# Patient Record
Sex: Female | Born: 1970 | Race: Black or African American | Hispanic: No | Marital: Married | Smoking: Never smoker
Health system: Southern US, Community
[De-identification: ages and names within clinical notes are randomized; demographics above are authoritative.]

## PROBLEM LIST (undated history)

## (undated) DIAGNOSIS — E7800 Pure hypercholesterolemia, unspecified: Secondary | ICD-10-CM

## (undated) DIAGNOSIS — R569 Unspecified convulsions: Secondary | ICD-10-CM

## (undated) DIAGNOSIS — R5383 Other fatigue: Secondary | ICD-10-CM

## (undated) DIAGNOSIS — K219 Gastro-esophageal reflux disease without esophagitis: Secondary | ICD-10-CM

## (undated) DIAGNOSIS — N946 Dysmenorrhea, unspecified: Secondary | ICD-10-CM

## (undated) DIAGNOSIS — N61 Mastitis without abscess: Secondary | ICD-10-CM

## (undated) DIAGNOSIS — D241 Benign neoplasm of right breast: Secondary | ICD-10-CM

## (undated) DIAGNOSIS — D509 Iron deficiency anemia, unspecified: Secondary | ICD-10-CM

## (undated) DIAGNOSIS — R55 Syncope and collapse: Secondary | ICD-10-CM

## (undated) DIAGNOSIS — I6389 Other cerebral infarction: Secondary | ICD-10-CM

## (undated) DIAGNOSIS — D219 Benign neoplasm of connective and other soft tissue, unspecified: Secondary | ICD-10-CM

## (undated) DIAGNOSIS — N6009 Solitary cyst of unspecified breast: Secondary | ICD-10-CM

## (undated) DIAGNOSIS — I10 Essential (primary) hypertension: Secondary | ICD-10-CM

## (undated) DIAGNOSIS — IMO0002 Reserved for concepts with insufficient information to code with codable children: Secondary | ICD-10-CM

## (undated) DIAGNOSIS — R32 Unspecified urinary incontinence: Secondary | ICD-10-CM

## (undated) DIAGNOSIS — R5381 Other malaise: Secondary | ICD-10-CM

## (undated) HISTORY — DX: Dysmenorrhea, unspecified: N94.6

## (undated) HISTORY — DX: Reserved for concepts with insufficient information to code with codable children: IMO0002

## (undated) HISTORY — DX: Other malaise: R53.81

## (undated) HISTORY — DX: Iron deficiency anemia, unspecified: D50.9

## (undated) HISTORY — DX: Solitary cyst of unspecified breast: N60.09

## (undated) HISTORY — DX: Unspecified urinary incontinence: R32

## (undated) HISTORY — DX: Benign neoplasm of connective and other soft tissue, unspecified: D21.9

## (undated) HISTORY — DX: Unspecified convulsions: R56.9

## (undated) HISTORY — DX: Other fatigue: R53.83

## (undated) HISTORY — DX: Essential (primary) hypertension: I10

## (undated) HISTORY — DX: Pure hypercholesterolemia, unspecified: E78.00

## (undated) HISTORY — DX: Benign neoplasm of right breast: D24.1

## (undated) HISTORY — DX: Gastro-esophageal reflux disease without esophagitis: K21.9

## (undated) HISTORY — DX: Syncope and collapse: R55

## (undated) HISTORY — DX: Other cerebral infarction: I63.89

## (undated) HISTORY — DX: Mastitis without abscess: N61.0

---

## 1994-04-24 HISTORY — PX: TUBAL LIGATION: SHX77

## 1997-10-02 ENCOUNTER — Other Ambulatory Visit: Admission: RE | Admit: 1997-10-02 | Discharge: 1997-10-02 | Payer: Self-pay | Admitting: Obstetrics and Gynecology

## 1998-05-11 ENCOUNTER — Encounter: Payer: Self-pay | Admitting: Emergency Medicine

## 1998-05-11 ENCOUNTER — Emergency Department (HOSPITAL_COMMUNITY): Admission: EM | Admit: 1998-05-11 | Discharge: 1998-05-11 | Payer: Self-pay | Admitting: Emergency Medicine

## 1999-03-09 ENCOUNTER — Encounter: Payer: Self-pay | Admitting: Emergency Medicine

## 1999-03-09 ENCOUNTER — Inpatient Hospital Stay (HOSPITAL_COMMUNITY): Admission: EM | Admit: 1999-03-09 | Discharge: 1999-03-13 | Payer: Self-pay | Admitting: Emergency Medicine

## 1999-10-18 ENCOUNTER — Encounter: Payer: Self-pay | Admitting: Family Medicine

## 1999-10-18 ENCOUNTER — Encounter: Admission: RE | Admit: 1999-10-18 | Discharge: 1999-10-18 | Payer: Self-pay | Admitting: Family Medicine

## 1999-11-14 ENCOUNTER — Ambulatory Visit (HOSPITAL_BASED_OUTPATIENT_CLINIC_OR_DEPARTMENT_OTHER): Admission: RE | Admit: 1999-11-14 | Discharge: 1999-11-14 | Payer: Self-pay | Admitting: General Surgery

## 1999-11-14 ENCOUNTER — Encounter (INDEPENDENT_AMBULATORY_CARE_PROVIDER_SITE_OTHER): Payer: Self-pay | Admitting: *Deleted

## 2000-06-24 ENCOUNTER — Emergency Department (HOSPITAL_COMMUNITY): Admission: EM | Admit: 2000-06-24 | Discharge: 2000-06-24 | Payer: Self-pay | Admitting: Emergency Medicine

## 2003-03-25 ENCOUNTER — Encounter: Admission: RE | Admit: 2003-03-25 | Discharge: 2003-03-25 | Payer: Self-pay | Admitting: Family Medicine

## 2004-02-07 ENCOUNTER — Observation Stay (HOSPITAL_COMMUNITY): Admission: EM | Admit: 2004-02-07 | Discharge: 2004-02-08 | Payer: Self-pay

## 2004-02-07 ENCOUNTER — Encounter (INDEPENDENT_AMBULATORY_CARE_PROVIDER_SITE_OTHER): Payer: Self-pay | Admitting: Specialist

## 2004-04-24 HISTORY — PX: APPENDECTOMY: SHX54

## 2004-10-05 ENCOUNTER — Emergency Department (HOSPITAL_COMMUNITY): Admission: EM | Admit: 2004-10-05 | Discharge: 2004-10-05 | Payer: Self-pay | Admitting: Family Medicine

## 2004-11-03 ENCOUNTER — Encounter: Admission: RE | Admit: 2004-11-03 | Discharge: 2004-11-03 | Payer: Self-pay | Admitting: Internal Medicine

## 2004-11-03 ENCOUNTER — Encounter (INDEPENDENT_AMBULATORY_CARE_PROVIDER_SITE_OTHER): Payer: Self-pay | Admitting: *Deleted

## 2005-05-09 ENCOUNTER — Other Ambulatory Visit: Admission: RE | Admit: 2005-05-09 | Discharge: 2005-05-09 | Payer: Self-pay | Admitting: Obstetrics & Gynecology

## 2006-05-29 ENCOUNTER — Emergency Department (HOSPITAL_COMMUNITY): Admission: EM | Admit: 2006-05-29 | Discharge: 2006-05-30 | Payer: Self-pay | Admitting: Family Medicine

## 2006-05-31 ENCOUNTER — Ambulatory Visit: Payer: Self-pay | Admitting: Cardiovascular Disease

## 2006-05-31 LAB — CONVERTED CEMR LAB
Free T4: 0.8 ng/dL (ref 0.6–1.6)
TSH: 0.56 microintl units/mL (ref 0.35–5.50)

## 2006-06-07 ENCOUNTER — Ambulatory Visit: Payer: Self-pay

## 2006-06-07 ENCOUNTER — Encounter: Payer: Self-pay | Admitting: Cardiology

## 2006-06-07 ENCOUNTER — Ambulatory Visit: Payer: Self-pay | Admitting: Cardiovascular Disease

## 2007-04-25 LAB — CONVERTED CEMR LAB

## 2008-05-25 ENCOUNTER — Encounter: Admission: RE | Admit: 2008-05-25 | Discharge: 2008-05-25 | Payer: Self-pay | Admitting: Family Medicine

## 2008-08-27 ENCOUNTER — Emergency Department (HOSPITAL_COMMUNITY): Admission: EM | Admit: 2008-08-27 | Discharge: 2008-08-27 | Payer: Self-pay | Admitting: Emergency Medicine

## 2008-12-01 ENCOUNTER — Encounter: Admission: RE | Admit: 2008-12-01 | Discharge: 2008-12-01 | Payer: Self-pay | Admitting: Family Medicine

## 2009-04-24 HISTORY — PX: BREAST SURGERY: SHX581

## 2009-06-28 ENCOUNTER — Encounter: Admission: RE | Admit: 2009-06-28 | Discharge: 2009-06-28 | Payer: Self-pay | Admitting: Family Medicine

## 2009-09-08 ENCOUNTER — Ambulatory Visit: Payer: Self-pay | Admitting: Internal Medicine

## 2009-09-08 DIAGNOSIS — I10 Essential (primary) hypertension: Secondary | ICD-10-CM

## 2009-09-08 DIAGNOSIS — R5381 Other malaise: Secondary | ICD-10-CM

## 2009-09-08 DIAGNOSIS — R569 Unspecified convulsions: Secondary | ICD-10-CM | POA: Insufficient documentation

## 2009-09-08 DIAGNOSIS — D509 Iron deficiency anemia, unspecified: Secondary | ICD-10-CM | POA: Insufficient documentation

## 2009-09-08 DIAGNOSIS — R5383 Other fatigue: Secondary | ICD-10-CM

## 2009-09-08 HISTORY — DX: Unspecified convulsions: R56.9

## 2009-09-08 HISTORY — DX: Essential (primary) hypertension: I10

## 2009-09-08 HISTORY — DX: Iron deficiency anemia, unspecified: D50.9

## 2009-09-08 HISTORY — DX: Other malaise: R53.81

## 2009-09-08 LAB — CONVERTED CEMR LAB
ALT: 13 units/L (ref 0–35)
AST: 29 units/L (ref 0–37)
Albumin: 4.3 g/dL (ref 3.5–5.2)
Alkaline Phosphatase: 29 units/L — ABNORMAL LOW (ref 39–117)
BUN: 10 mg/dL (ref 6–23)
Basophils Absolute: 0 10*3/uL (ref 0.0–0.1)
Basophils Relative: 0.5 % (ref 0.0–3.0)
Bilirubin, Direct: 0 mg/dL (ref 0.0–0.3)
CO2: 30 meq/L (ref 19–32)
Calcium: 9.3 mg/dL (ref 8.4–10.5)
Chloride: 104 meq/L (ref 96–112)
Creatinine, Ser: 0.6 mg/dL (ref 0.4–1.2)
Eosinophils Absolute: 0 10*3/uL (ref 0.0–0.7)
Eosinophils Relative: 0.4 % (ref 0.0–5.0)
Ferritin: 3.1 ng/mL — ABNORMAL LOW (ref 10.0–291.0)
Folate: 12.5 ng/mL
GFR calc non Af Amer: 136.22 mL/min (ref >60)
Glucose, Bld: 86 mg/dL (ref 70–99)
HCT: 29.6 % — ABNORMAL LOW (ref 36.0–46.0)
Hemoglobin: 9.3 g/dL — ABNORMAL LOW (ref 12.0–15.0)
Lymphocytes Relative: 35.7 % (ref 12.0–46.0)
Lymphs Abs: 1.9 10*3/uL (ref 0.7–4.0)
MCHC: 31.3 g/dL (ref 30.0–36.0)
MCV: 64.7 fL — ABNORMAL LOW (ref 78.0–100.0)
Monocytes Absolute: 0.4 10*3/uL (ref 0.1–1.0)
Monocytes Relative: 6.8 % (ref 3.0–12.0)
Neutro Abs: 2.9 10*3/uL (ref 1.4–7.7)
Neutrophils Relative %: 56.6 % (ref 43.0–77.0)
Platelets: 317 10*3/uL (ref 150.0–400.0)
Potassium: 4 meq/L (ref 3.5–5.1)
RBC: 4.58 M/uL (ref 3.87–5.11)
RDW: 19 % — ABNORMAL HIGH (ref 11.5–14.6)
Sodium: 141 meq/L (ref 135–145)
TSH: 1.1 microintl units/mL (ref 0.35–5.50)
Total Bilirubin: 0.4 mg/dL (ref 0.3–1.2)
Total Protein: 7.4 g/dL (ref 6.0–8.3)
Vitamin B-12: 255 pg/mL (ref 211–911)
WBC: 5.2 10*3/uL (ref 4.5–10.5)

## 2010-01-16 ENCOUNTER — Encounter: Payer: Self-pay | Admitting: Internal Medicine

## 2010-01-16 ENCOUNTER — Emergency Department (HOSPITAL_COMMUNITY): Admission: EM | Admit: 2010-01-16 | Discharge: 2010-01-16 | Payer: Self-pay | Admitting: Emergency Medicine

## 2010-01-16 DIAGNOSIS — R55 Syncope and collapse: Secondary | ICD-10-CM

## 2010-01-16 HISTORY — DX: Syncope and collapse: R55

## 2010-01-16 LAB — CONVERTED CEMR LAB
BUN: 7 mg/dL
Basophils Relative: 0 %
CO2: 28 meq/L
Calcium: 9.2 mg/dL
Chloride: 105 meq/L
Creatinine, Ser: 0.69 mg/dL
Eosinophils Relative: 0 %
Glucose, Bld: 87 mg/dL
HCT: 31 %
Hemoglobin: 9.5 g/dL
Lymphocytes, automated: 23 %
MCV: 69.2 fL
Monocytes Relative: 5 %
Neutrophils Relative %: 71 %
Platelets: 304 10*3/uL
Potassium: 3.4 meq/L
RBC: 4.48 M/uL
RDW: 16.7 %
Sodium: 139 meq/L
WBC: 7.4 10*3/uL

## 2010-01-17 ENCOUNTER — Encounter: Payer: Self-pay | Admitting: Internal Medicine

## 2010-01-18 ENCOUNTER — Ambulatory Visit: Payer: Self-pay | Admitting: Internal Medicine

## 2010-03-10 ENCOUNTER — Ambulatory Visit: Payer: Self-pay | Admitting: Internal Medicine

## 2010-03-10 DIAGNOSIS — N6009 Solitary cyst of unspecified breast: Secondary | ICD-10-CM | POA: Insufficient documentation

## 2010-03-10 DIAGNOSIS — N61 Mastitis without abscess: Secondary | ICD-10-CM | POA: Insufficient documentation

## 2010-03-10 HISTORY — DX: Solitary cyst of unspecified breast: N60.09

## 2010-03-10 HISTORY — DX: Mastitis without abscess: N61.0

## 2010-03-18 ENCOUNTER — Encounter: Admission: RE | Admit: 2010-03-18 | Discharge: 2010-03-18 | Payer: Self-pay | Admitting: Internal Medicine

## 2010-03-18 LAB — HM MAMMOGRAPHY

## 2010-05-24 NOTE — Assessment & Plan Note (Signed)
Summary: post hospital f/u - pt told to f/u 1-2 days/cd   Vital Signs:  Patient profile:   40 year old female Height:      67 inches (170.18 cm) Weight:      155.8 pounds (70.82 kg) O2 Sat:      99 % on Room air Temp:     98.6 degrees F (37.00 degrees C) oral Pulse rate:   97 / minute BP sitting:   126 / 82  (left arm) Cuff size:   regular  Vitals Entered By: Orlan Leavens RMA (January 18, 2010 9:36 AM)  O2 Flow:  Room air CC: ER F/U Is Patient Diabetic? No Pain Assessment Patient in pain? no        Primary Care Provider:  Newt Lukes MD  CC:  ER F/U.  History of Present Illness:  here for ER f/u -  seen 01/16/10 for syncope - felt d/t vasovagal - cardiac eval neg labs reviewed - no further events since home  iron defic anemia - dx same 08/2009 -  taking oral iron two times a day as rx'd - no improvement cont heavy menses and ?plans for hysterectomy due to fibroids  HTN - reports noncompliance with prescribed medical treatment during 11/2009 (ran out); no prescribed changes in medication dose or frequency. denies adverse side effects related to current therapy. (prev OV with cards x 1 in feb 2008 noted similar issues with HTN and med noncompliance) - denies HA, CP, SOB or vision changes - no weakness in extremeties  continued fatigue -  symptoms ongoing for 6 months or more-  denies fever or weight changes-  +associated depression symptoms including ahedonia and lack of intrest in enjoyable activities, low sex drive, feeling sad.  +anxiety spells at work.  no medication changes or use of OTC meds.  no change in menstral cycle -  no abd pain, head pain, dizziness or DOE/SOB.  symptoms worse when stressed.  symptoms not relieved with rest or sleep  Clinical Review Panels:  CBC   WBC:  7.4 (01/16/2010)   RBC:  4.48 (01/16/2010)   Hgb:  9.5 (01/16/2010)   Hct:  31.0 (01/16/2010)   Platelets:  304 (01/16/2010)   MCV  69.2 (01/16/2010)   MCHC  31.3  (09/08/2009)   RDW  16.7 (01/16/2010)   PMN:  71 (01/16/2010)   Lymphs:  35.7 (09/08/2009)   Monos:  5 (01/16/2010)   Eosinophils:  0 (01/16/2010)   Basophil:  0 (01/16/2010)  Complete Metabolic Panel   Glucose:  87 (01/16/2010)   Sodium:  139 (01/16/2010)   Potassium:  3.4 (01/16/2010)   Chloride:  105 (01/16/2010)   CO2:  28 (01/16/2010)   BUN:  7 (01/16/2010)   Creatinine:  0.69 (01/16/2010)   Albumin:  4.3 (09/08/2009)   Total Protein:  7.4 (09/08/2009)   Calcium:  9.2 (01/16/2010)   Total Bili:  0.4 (09/08/2009)   Alk Phos:  29 (09/08/2009)   SGPT (ALT):  13 (09/08/2009)   SGOT (AST):  29 (09/08/2009)   Current Medications (verified): 1)  Diovan 160 Mg Tabs (Valsartan) .Marland Kitchen.. 1 By Mouth Once Daily 2)  Iron 325 (65 Fe) Mg Tabs (Ferrous Sulfate) .Marland Kitchen.. 1 By Mouth Two Times A Day  Allergies (verified): No Known Drug Allergies  Past History:  Past Medical History: Anemia, iron defic Hypertension Seizure disorder  Md roster: gyn - [place] [facility] cards - nishan (remote x 1 ov)  Review of Systems  The patient denies fever,  weight loss, chest pain, headaches, hemoptysis, abdominal pain, melena, and hematochezia.    Physical Exam  General:  alert, well-developed, well-nourished, and cooperative to examination.    Ears:  normal pinnae bilaterally, without erythema, swelling, or tenderness to palpation. TMs clear, without effusion, or cerumen impaction. Hearing grossly normal bilaterally  Mouth:  teeth and gums in good repair; mucous membranes moist, without lesions or ulcers. oropharynx clear without exudate, no erythema.  Lungs:  normal respiratory effort, no intercostal retractions or use of accessory muscles; normal breath sounds bilaterally - no crackles and no wheezes.    Heart:  normal rate, regular rhythm, no murmur, and no rub. BLE without edema.  Neurologic:  alert & oriented X3 and cranial nerves II-XII symetrically intact.  strength normal in all extremities,  sensation intact to light touch, and gait normal. speech fluent without dysarthria or aphasia; follows commands with good comprehension.  Psych:  Oriented X3, memory intact for recent and remote, normally interactive, good eye contact, not anxious appearing, not depressed appearing, and not agitated.      Impression & Recommendations:  Problem # 1:  SYNCOPE (ICD-780.2) ER records and labs 01/16/10 reviewed - agree likely vasovagal (event s/p funeral and singing) - reassurance and education provided  Problem # 2:  HYPERTENSION (ICD-401.9)  Her updated medication list for this problem includes:    Diovan 160 Mg Tabs (Valsartan) .Marland Kitchen... 1 by mouth once daily  well controlled today back on med tx reminded of need for med compliance (no meds 11/2009 when ran out refills) again advised on need to prevent pregnacy while on this med tx - exam benign -  BP today: 126/82 Prior BP: 144/98 (09/08/2009)  Labs Reviewed: K+: 3.4 (01/16/2010) Creat: : 0.69 (01/16/2010)     Problem # 3:  ANEMIA, IRON DEFICIENCY (ICD-280.9)  Her updated medication list for this problem includes:    Iron 325 (65 Fe) Mg Tabs (Ferrous sulfate) .Marland Kitchen... 1 by mouth two times a day  cont low levels but no need transfusion ongoing eval by gyn for fibroids and menorragia - advised f/u for same - cont iron two times a day  recheck hgb 6 mo if no hysterectomy done by that time  Hgb: 9.5 (01/16/2010)   Hct: 31.0 (01/16/2010)   Platelets: 304 (01/16/2010) RBC: 4.48 (01/16/2010)   RDW: 16.7 (01/16/2010)   WBC: 7.4 (01/16/2010) MCV: 69.2 (01/16/2010)   MCHC: 31.3 (09/08/2009) Ferritin: 3.1 (09/08/2009) B12: 255 (09/08/2009)   Folate: 12.5 (09/08/2009)   TSH: 1.10 (09/08/2009)  Complete Medication List: 1)  Diovan 160 Mg Tabs (Valsartan) .Marland Kitchen.. 1 by mouth once daily 2)  Iron 325 (65 Fe) Mg Tabs (Ferrous sulfate) .Marland Kitchen.. 1 by mouth two times a day  Patient Instructions: 1)  it was good to see you today. 2)  ER records and labs  reviewed today - no heart problems identified - 3)  stay hydrated and take your medications as dicussed! 4)  followup with gynecology about your fibroids and the anemia - may be time to plan that hysterectomy - 5)  refills on your blood pressure medication done today  6)  Please schedule a follow-up appointment in 6 months to recheck blood pressure and labs, call sooner if problems.  Prescriptions: DIOVAN 160 MG TABS (VALSARTAN) 1 by mouth once daily  #30 x 6   Entered and Authorized by:   Newt Lukes MD   Signed by:   Newt Lukes MD on 01/18/2010   Method used:   [facility]  [facility]        [facility]  [address] [phone]* (retail)       [address]       [place], [place]  [id number]       Ph: [phone] or [phone]       Fax: [phone]   RxID:   [id number]

## 2010-05-24 NOTE — Assessment & Plan Note (Signed)
Summary: BREAST PAIN/NWS   Vital Signs:  Patient profile:   40 year old female Height:      67 inches (170.18 cm) Weight:      155 pounds (70.45 kg) O2 Sat:      99 % on Room air Temp:     98.8 degrees F (37.11 degrees C) oral Pulse rate:   86 / minute BP sitting:   118 / 82  (left arm) Cuff size:   regular  Vitals Entered By: Orlan Leavens RMA (March 10, 2010 11:19 AM)  O2 Flow:  Room air CC: Pain in both breast Is Patient Diabetic? No Pain Assessment Patient in pain? yes     Location: Breast Type: aching   Primary Care Provider:  Newt Lukes MD  CC:  Pain in both breast.  History of Present Illness: c/o breast pain onset late 12/2009 - progressive symptoms each week - affects right>left side (known cyst on right side) breasts feel "swollen and heavy", no redness or nipple discharge no fever, no HA no SOB pain improved but not resolved with advil  Clinical Review Panels:  Prevention   Last Mammogram:  No specific mammographic evidence of malignancy.   (05/25/2009)   Last Pap Smear:  Interpretation/ Result:Negative for intraepithelial Lesion or Malignancy.    (04/25/2007)   Current Medications (verified): 1)  Diovan 160 Mg Tabs (Valsartan) .Marland Kitchen.. 1 By Mouth Once Daily 2)  Iron 325 (65 Fe) Mg Tabs (Ferrous Sulfate) .Marland Kitchen.. 1 By Mouth Two Times A Day  Allergies (verified): No Known Drug Allergies  Past History:  Past Medical History: Anemia, iron defic Hypertension Seizure disorder  Md roster: gyn - [place] gy[facility] cards - nishan (remote x 1 ov)   Review of Systems  The patient denies fever, chest pain, headaches, and abdominal pain.    Physical Exam  General:  alert, well-developed, well-nourished, and cooperative to examination.    Breasts:  symmetric breasts. 1cm cyst right breast 10 o'clock, 2 cm away from areolar edge -tender across lateral side of breast near chest wall - left breast also tender lateral side but no appreciable cyst. no  skin texture change or discharge Lungs:  normal respiratory effort, no intercostal retractions or use of accessory muscles; normal breath sounds bilaterally - no crackles and no wheezes.    Heart:  normal rate, regular rhythm, no murmur, and no rub. BLE without edema.    Impression & Recommendations:  Problem # 1:  BREAST CYST, RIGHT (ICD-610.0) ?enlarging cyst or other problem causing tenderness - refer for dx US/mammo now pt reports has considered removal in past due to tenderness at cyst location - may reconsider surg options now based on imaging results and response to NSAIDs Orders: Misc. Referral (Misc. Ref)  Problem # 2:  MASTITIS (ICD-611.0) NSAID for pain while eval as above - no signs of infx ongoing Orders: Misc. Referral (Misc. Ref)  Complete Medication List: 1)  Diovan 160 Mg Tabs (Valsartan) .Marland Kitchen.. 1 by mouth once daily 2)  Iron 325 (65 Fe) Mg Tabs (Ferrous sulfate) .Marland Kitchen.. 1 by mouth two times a day 3)  Diclofenac Sodium 50 Mg Tbec (Diclofenac sodium) .Marland Kitchen.. 1 by mouth two times a day x 7 days, then as needed for pain symptoms  Patient Instructions: 1)  it was good to see you today. 2)  diclofenac for antiinflammatory to help treat pain symptoms - use in place of advil/ibuprofen - your prescription has been electronically submitted to your pharmacy. Please take as directed.  Contact our office if you believe you're having problems with the medication(s).  3)  we'll make referral for repeat mammogram and or ultrasound . Our office will contact you regarding this appointment once made.  Consider surgical treatment if increase size of cyst or other problems 4)  Please schedule a follow-up appointment as needed. Prescriptions: DICLOFENAC SODIUM 50 MG TBEC (DICLOFENAC SODIUM) 1 by mouth two times a day x 7 days, then as needed for pain symptoms  #30 x 0   Entered and Authorized by:   Newt Lukes MD   Signed by:   Newt Lukes MD on 03/10/2010   Method used:    [facility]        [facility]  [address] [phone]* (retail)       [address]       [place], [place]  [id number]       Ph: [phone] or [phone]       Fax: [phone]   RxID:   [id number]    Orders Added: 1)  Misc. Referral [Misc. Ref] 2)  Est. Patient Level IV [40347]

## 2010-05-24 NOTE — Assessment & Plan Note (Signed)
Summary: NEW [employer] PT--PKG/OFF-#---STC   Vital Signs:  Patient profile:   40 year old female Height:      67 inches (170.18 cm) Weight:      153.4 pounds (69.73 kg) BMI:     24.11 O2 Sat:      99 % on Room air Temp:     97.7 degrees F (36.50 degrees C) oral Pulse rate:   81 / minute BP sitting:   144 / 98  (left arm) Cuff size:   regular  Vitals Entered By: Orlan Leavens (Sep 08, 2009 8:24 AM)  O2 Flow:  Room air CC: New patient Is Patient Diabetic? No Pain Assessment Patient in pain? no        Primary Care Provider:  Newt Lukes MD  CC:  New patient.  History of Present Illness: new pt to me and our practice, here to est care  1) concern about HTN - high BP readings for years but has not taken meds for same in many years- (prev OV with cards x 1 in feb 2008 notes similar issues with HTN and med noncompliance) - denies HA, CP, SOB or vision changes - no weakness in extremeties  2) c/o fatigue -  worse in last 4-5 days, but symptoms ongoing for 3 months or more-  denies fever or weight changes ,  +associated depression symptoms including ahedonia and lack of intrest in enjoyable activities, low sex drive, feeling sad.  +anxiety spells at work.  no medication changes or use of OTC meds.  no change in menstral cycle -  no abd pain, head pain, dizziness or DOE/SOB.  symptoms worse when stressed.  symptoms not relieved with rest or sleep  Preventive Screening-Counseling & Management  Alcohol-Tobacco     Alcohol drinks/day: <1     Alcohol Counseling: not indicated; use of alcohol is not excessive or problematic     Smoking Status: never     Tobacco Counseling: not indicated; no tobacco use  Caffeine-Diet-Exercise     Caffeine use/day: 2     Caffeine Counseling: not indicated; caffeine use is not excessive or problematic     Does Patient Exercise: yes     Type of exercise: walk     Times/week: 4     Exercise Counseling: not indicated; exercise is  adequate  Safety-Violence-Falls     Seat Belt Counseling: not indicated; patient wears seat belts     Helmet Counseling: not indicated; patient wears helmet when riding bicycle/motocycle     Firearms in the Home: no firearms in the home     Firearm Counseling: not applicable     Smoke Detectors: yes     Smoke Detector Counseling: n/a     Violence Counseling: not indicated; no violence risk noted     Fall Risk Counseling: not indicated; no significant falls noted  Clinical Review Panels:  Prevention   Last Mammogram:  No specific mammographic evidence of malignancy.   (05/25/2009)   Last Pap Smear:  Interpretation/ Result:Negative for intraepithelial Lesion or Malignancy.    (04/25/2007)   Current Medications (verified): 1)  None  Allergies (verified): No Known Drug Allergies  Past History:  Past Medical History: Anemia-NOS Hypertension Seizure disorder  Md rooster: gyn - [place] [facility] cards - nishan (remote x 1 ov)  Past Surgical History: Breast biopsy, right (2001) Appendectomy (2006)   Family History: Family History of Colon CA 1st degree relative <60 (grandparent) Family History Diabetes 1st degree relative (parent, grandparent, other relative)  Family History Hypertension (parent, grandparent) Family History Ovarian cancer (mom) Heaert disease (parent, grandparent) Stroke (parent, grandparent)   Social History: Never Smoked married, lives at home with spouse and 3 dtrs 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 I have reviewed all other systems and they were negative.   Physical Exam  General:  alert, well-developed, well-nourished, and cooperative to examination.    Head:  Normocephalic and atraumatic without obvious abnormalities. No apparent alopecia or balding. Eyes:  vision grossly intact; pupils  equal, round and reactive to light.  conjunctiva and lids normal.    Ears:  normal pinnae bilaterally, without erythema, swelling, or tenderness to palpation. TMs clear, without effusion, or cerumen impaction. Hearing grossly normal bilaterally  Mouth:  teeth and gums in good repair; mucous membranes moist, without lesions or ulcers. oropharynx clear without exudate, no erythema.  Lungs:  normal respiratory effort, no intercostal retractions or use of accessory muscles; normal breath sounds bilaterally - no crackles and no wheezes.    Heart:  normal rate, regular rhythm, no murmur, and no rub. BLE without edema. normal DP pulses and normal cap refill in all 4 extremities    Abdomen:  soft, non-tender, normal bowel sounds, no distention; no masses and no appreciable hepatomegaly or splenomegaly.   Genitalia:  defer to gyn Msk:  No deformity or scoliosis noted of thoracic or lumbar spine.   Neurologic:  alert & oriented X3 and cranial nerves II-XII symetrically intact.  strength normal in all extremities, sensation intact to light touch, and gait normal. speech fluent without dysarthria or aphasia; follows commands with good comprehension.  Skin:  no rashes, vesicles, ulcers, or erythema. No nodules or irregularity to palpation.  Psych:  Oriented X3, memory intact for recent and remote, normally interactive, good eye contact, not anxious appearing, dysphoric affect and midly depressed appearing, and not agitated.      Impression & Recommendations:  Problem # 1:  HYPERTENSION (ICD-401.9) will check labs and start med for same - advised on need to prevent pregnacy while on this med tx - exam benign - recheck in 2 weeks to eval BP and med effects 2 week sample + e-rx done Her updated medication list for this problem includes:    Diovan 160 Mg Tabs (Valsartan) .Marland Kitchen... 1 by mouth once daily  BP today: 144/98  Problem # 2:  FATIGUE (ICD-780.79) exam and hx unremarkable- labs to look for underlying  med dz - consider tx for probable depression co-existing if no other lab abn to explain symptoms  d/w pt same who agrees to this plan also send for prior records - Orders: TLB-BMP (Basic Metabolic Panel-BMET) (80048-METABOL) TLB-CBC Platelet - w/Differential (85025-CBCD) TLB-Hepatic/Liver Function Pnl (80076-HEPATIC) TLB-TSH (Thyroid Stimulating Hormone) (84443-TSH)  Problem # 3:  ANEMIA-NOS (ICD-285.9) hx same- check now TSH: 0.56 (05/31/2006)  Complete Medication List: 1)  Diovan 160 Mg Tabs (Valsartan) .Marland Kitchen.. 1 by mouth once daily  Patient Instructions: 1)  it was good to meet you today. 2)  test(s) ordered today - your results will be posted on the phone tree for review in 48-72 hours from the time of test completion; call [phone] and enter your 9 digit MRN (listed above on this page, just below your name); if any changes need to be made or there are abnormal results, you will be  contacted directly.  3)  start Diovan for high blood pressure- 2 week samples provided and your prescription has been electronically submitted to your pharmacy. Please take as directed. Contact our office if you believe you're having problems with the medication(s).  4)  will send for prior records to review 5)  Please schedule a follow-up appointment in 1-2 weeks to recheck blood pressure and consider other treatment as needed, call sooner if problems.  Prescriptions: DIOVAN 160 MG TABS (VALSARTAN) 1 by mouth once daily  #30 x 2   Entered and Authorized by:   Newt Lukes MD   Signed by:   Newt Lukes MD on 09/08/2009   Method used:   [facility]        [facility]  [address] [facility] [address] [phone]* (retail)       [address]       [place], [place]  [id number]       Ph: [phone] or [phone]       Fax: [phone]   RxID:   [id number]    Pap Smear  Procedure date:  04/25/2007  Findings:      Interpretation/ Result:Negative for intraepithelial  Lesion or Malignancy.     Mammogram  Procedure date:  05/25/2009  Findings:      No specific mammographic evidence of malignancy.

## 2010-05-24 NOTE — Letter (Signed)
Summary: [facility]   Imported By: Sherian Rein 01/19/2010 14:55:17  _____________________________________________________________________  External Attachment:    Type:   Image     Comment:   External Document

## 2010-06-14 ENCOUNTER — Other Ambulatory Visit: Payer: Self-pay | Admitting: Internal Medicine

## 2010-06-14 DIAGNOSIS — Z1231 Encounter for screening mammogram for malignant neoplasm of breast: Secondary | ICD-10-CM

## 2010-07-07 LAB — URINALYSIS, ROUTINE W REFLEX MICROSCOPIC
Bilirubin Urine: NEGATIVE
Glucose, UA: NEGATIVE mg/dL
Hgb urine dipstick: NEGATIVE
Ketones, ur: NEGATIVE mg/dL
Nitrite: NEGATIVE
Protein, ur: NEGATIVE mg/dL
Specific Gravity, Urine: 1.01 (ref 1.005–1.030)
Urobilinogen, UA: 0.2 mg/dL (ref 0.0–1.0)
pH: 7 (ref 5.0–8.0)

## 2010-07-07 LAB — CBC
HCT: 31 % — ABNORMAL LOW (ref 36.0–46.0)
Hemoglobin: 9.5 g/dL — ABNORMAL LOW (ref 12.0–15.0)
MCH: 21.2 pg — ABNORMAL LOW (ref 26.0–34.0)
MCHC: 30.6 g/dL (ref 30.0–36.0)
MCV: 69.2 fL — ABNORMAL LOW (ref 78.0–100.0)
Platelets: 304 10*3/uL (ref 150–400)
RBC: 4.48 MIL/uL (ref 3.87–5.11)
RDW: 16.7 % — ABNORMAL HIGH (ref 11.5–15.5)
WBC: 7.4 10*3/uL (ref 4.0–10.5)

## 2010-07-07 LAB — DIFFERENTIAL
Basophils Absolute: 0 10*3/uL (ref 0.0–0.1)
Basophils Relative: 0 % (ref 0–1)
Eosinophils Absolute: 0 10*3/uL (ref 0.0–0.7)
Eosinophils Relative: 0 % (ref 0–5)
Lymphocytes Relative: 23 % (ref 12–46)
Lymphs Abs: 1.7 10*3/uL (ref 0.7–4.0)
Monocytes Absolute: 0.4 10*3/uL (ref 0.1–1.0)
Monocytes Relative: 5 % (ref 3–12)
Neutro Abs: 5.2 10*3/uL (ref 1.7–7.7)
Neutrophils Relative %: 71 % (ref 43–77)

## 2010-07-07 LAB — BASIC METABOLIC PANEL
BUN: 7 mg/dL (ref 6–23)
CO2: 28 mEq/L (ref 19–32)
Calcium: 9.2 mg/dL (ref 8.4–10.5)
Chloride: 105 mEq/L (ref 96–112)
Creatinine, Ser: 0.69 mg/dL (ref 0.4–1.2)
GFR calc Af Amer: >60 mL/min (ref >60)
GFR calc non Af Amer: >60 mL/min (ref >60)
Glucose, Bld: 87 mg/dL (ref 70–99)
Potassium: 3.4 mEq/L — ABNORMAL LOW (ref 3.5–5.1)
Sodium: 139 mEq/L (ref 135–145)

## 2010-07-07 LAB — POCT CARDIAC MARKERS
CKMB, poc: <1 ng/mL — ABNORMAL LOW (ref 1.0–8.0)
Myoglobin, poc: 76.5 ng/mL (ref 12–200)
Troponin i, poc: <0.05 ng/mL (ref 0.00–0.09)

## 2010-07-07 LAB — PREGNANCY, URINE: Preg Test, Ur: NEGATIVE

## 2010-07-15 ENCOUNTER — Other Ambulatory Visit: Payer: Self-pay | Admitting: Internal Medicine

## 2010-07-15 ENCOUNTER — Ambulatory Visit
Admission: RE | Admit: 2010-07-15 | Discharge: 2010-07-15 | Disposition: A | Discharge status: Home / Self Care | Payer: 59 | Source: Ambulatory Visit | Attending: Internal Medicine | Admitting: Internal Medicine

## 2010-07-15 DIAGNOSIS — Z1231 Encounter for screening mammogram for malignant neoplasm of breast: Secondary | ICD-10-CM

## 2010-08-09 IMAGING — MG MM DIGITAL DIAGNOSTIC BILAT
4 series · 4 of 4 positions shown · non-contrast
Comparison: With priors

CLINICAL DATA: Short-term interval follow-up of the right breast
for a probable fibroadenoma

DIGITAL DIAGNOSTIC BILATERAL MAMMOGRAM WITH CAD AND RIGHT BREAST
ULTRASOUND:

[R CC]
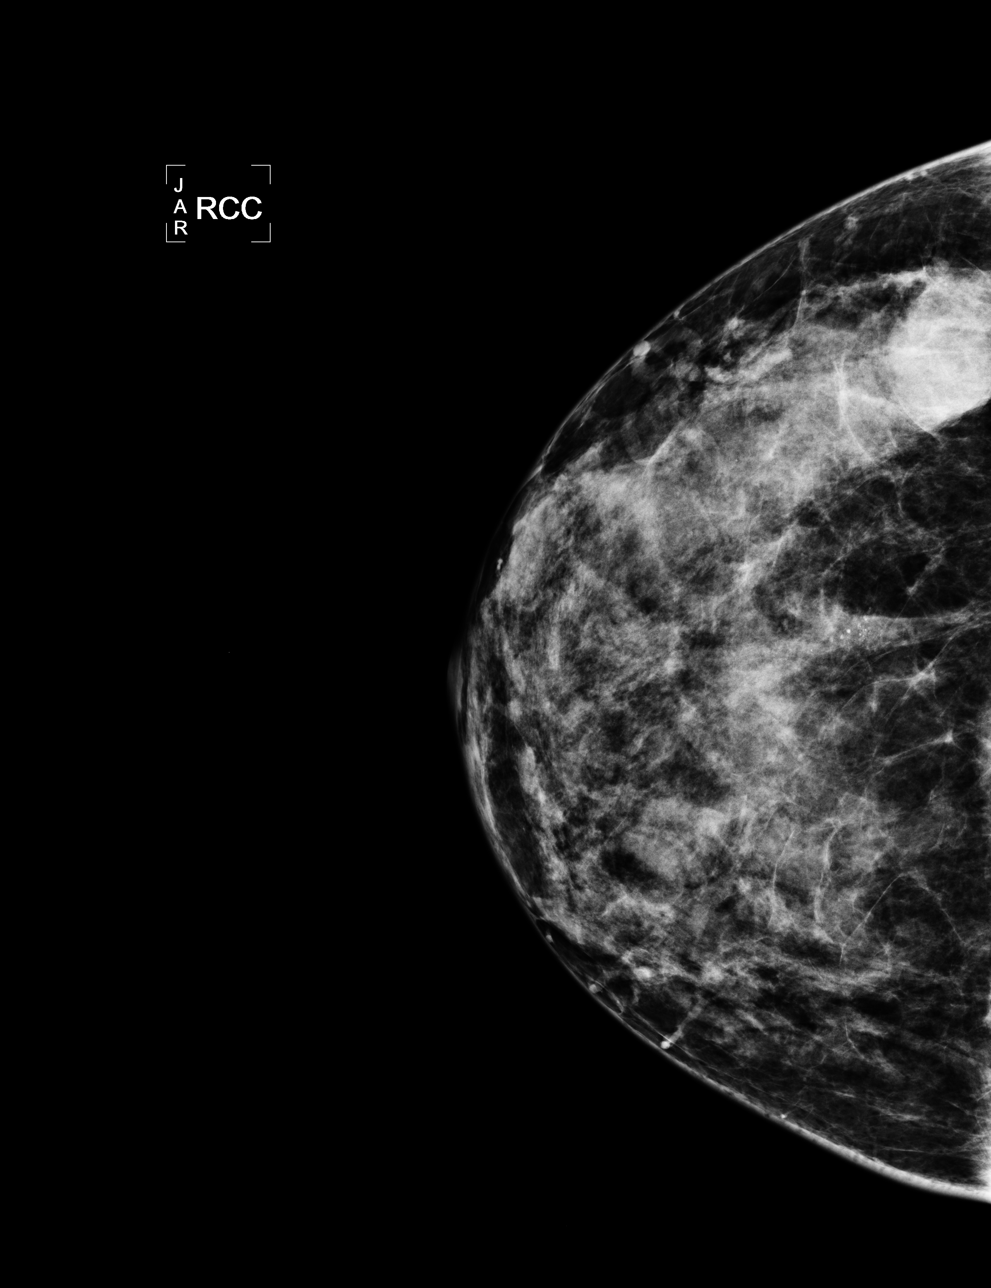

[L CC]
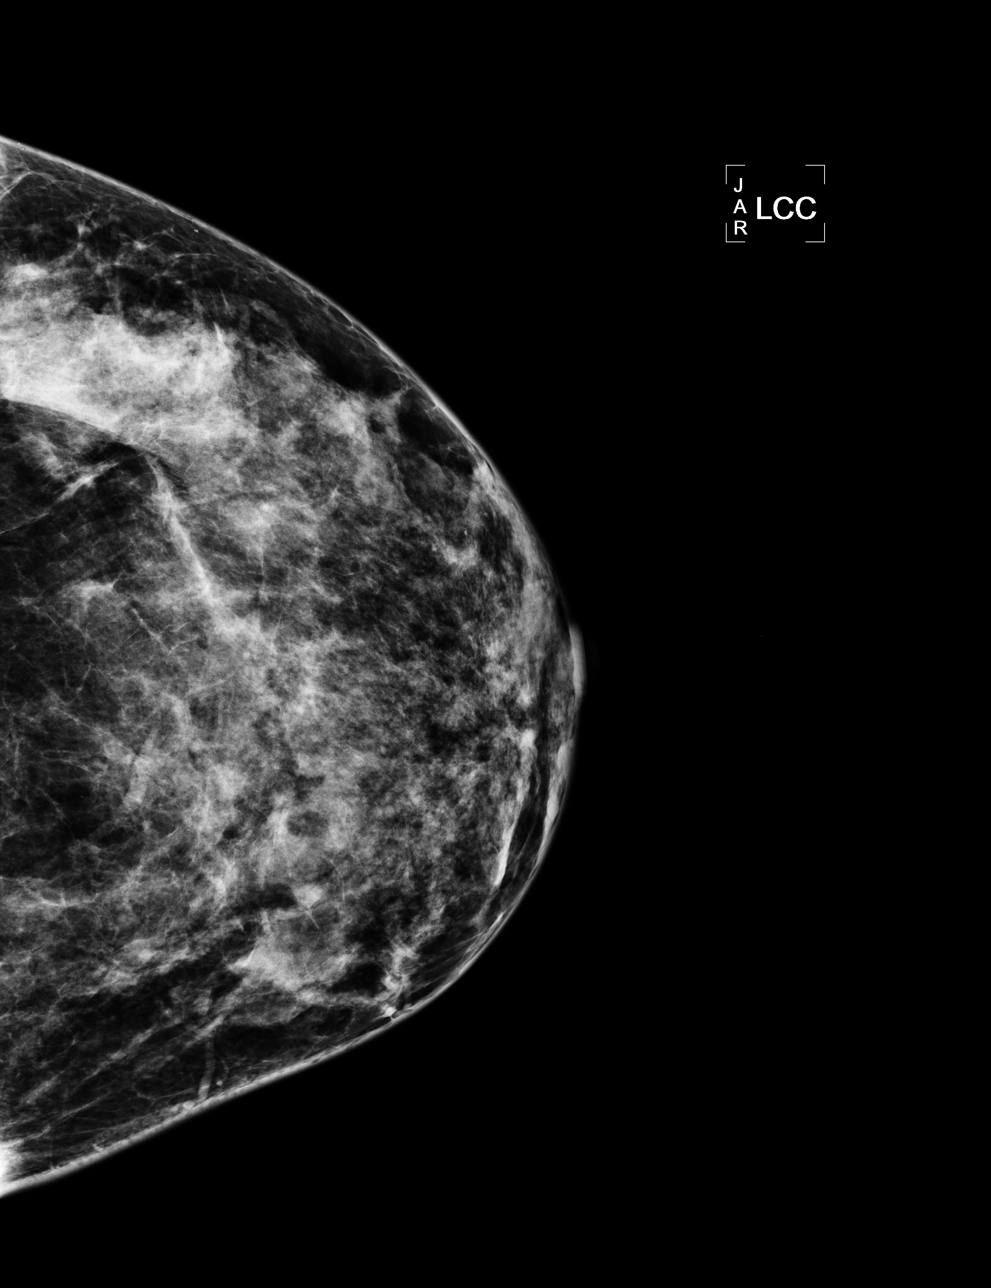

[L MLO]
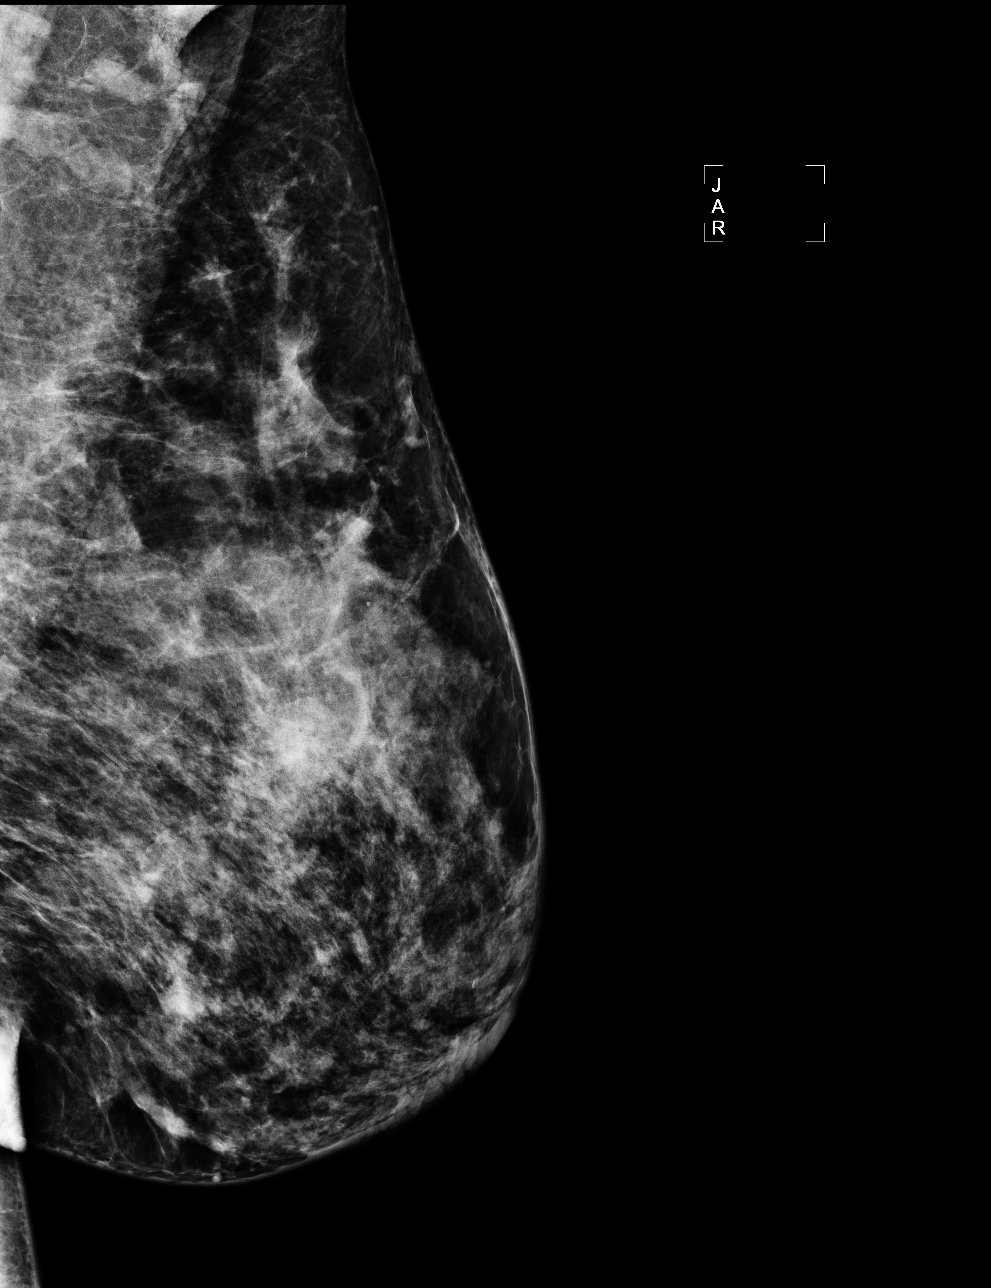

[R MLO]
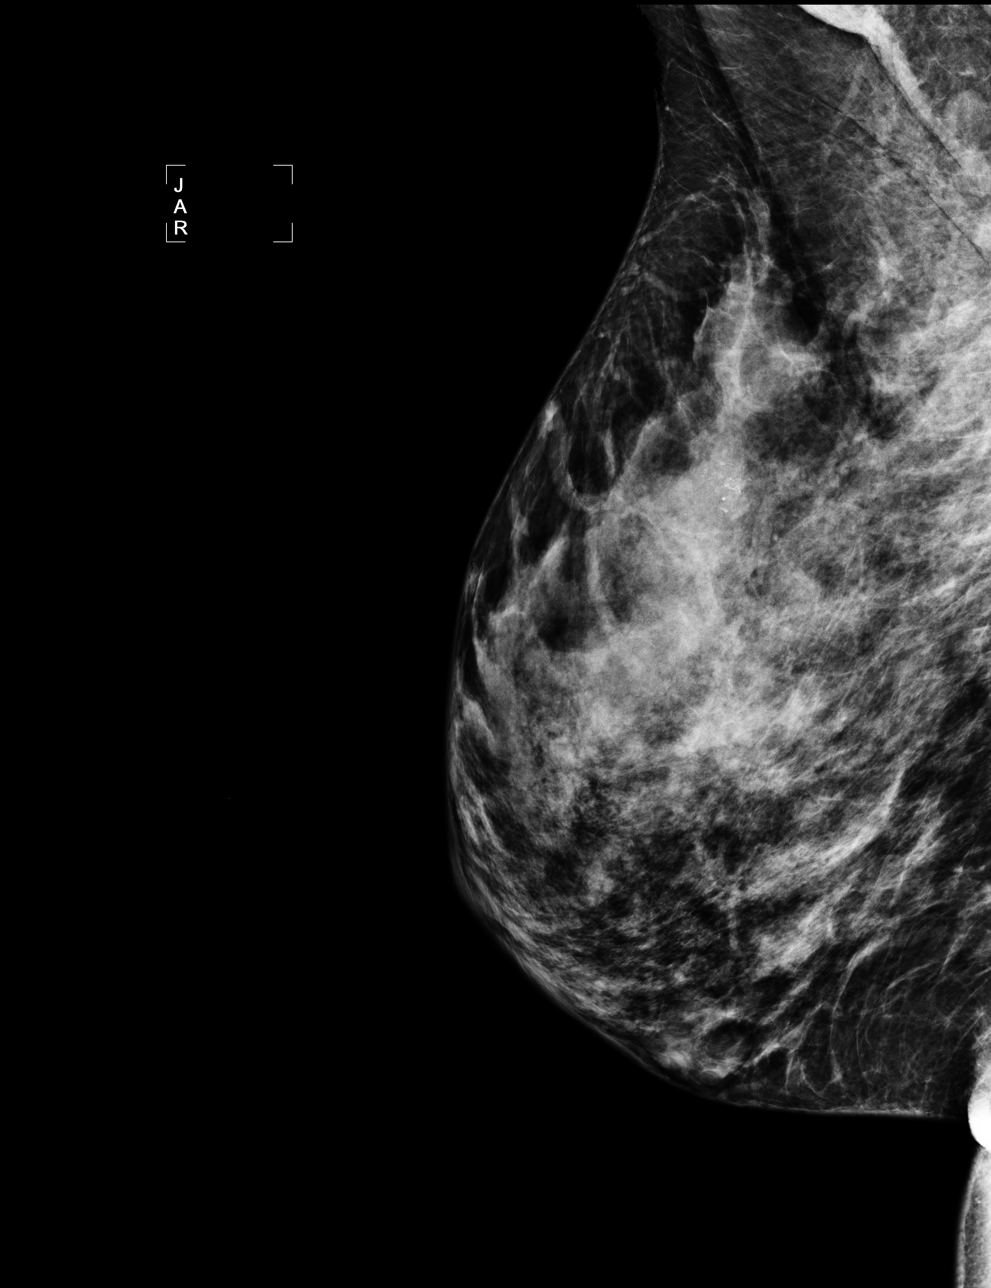

[4 of 4 positions shown; findings below may reference images not displayed]

FINDINGS: There is a dense fibroglandular pattern.  There is no
new suspicious mass or malignant-type microcalcifications in either
breast.  A stable obscured mass is seen in the upper outer quadrant
of the right breast.
Mammographic images were processed with CAD.

On physical exam, I palpate a discrete, freely mobile mass in the
right breast at 11 o'clock 10 cm from the nipple.  The patient
states that clinically it is stable.

Ultrasound is performed, showing there is a well-circumscribed,
hypo echoic mass in the right breast at 11 o'clock 10 cm from the
nipple measuring 2 x 1.5 x 2.4 cm.  On the prior ultrasound dated
12/01/2008 it measured 2.5 x 1.6 x 2.1 cm.
IMPRESSION: Probable stable fibroadenoma in the right breast.  Bilateral
diagnostic mammogram and right breast ultrasound in 1 year is
recommended to document stability of the right breast mass for a 2-
year time interval.  The importance of self breast examination was
discussed with the patient.

BI-RADS CATEGORY 3:  Probably benign finding(s) - short interval
follow-up suggested.

## 2010-09-09 NOTE — Consult Note (Signed)
[place]  Patient:    Linda Castro                       MRN: [id number] Proc. Date: 03/09/99 Adm. Date:  [id number] Attending:  Anastasio Auerbach                          Consultation Report  CHIEF COMPLAINT:  Left hemiparesis.  HISTORY OF PRESENT ILLNESS:  A 40 year old woman, who earlier this afternoon while at work, developed a sudden sharp frontoparietal headache on the right side followed by unresponsiveness.  According to a witness, who is a friend, refers hat her body stiffened out, especially on the left side, left upper extremity and some in the left lower extremity.  Her friend also noticed she was foaming from her mouth.  The episode lasted about five minutes and she slowly started to recover her consciousness with postictal state.  There is no history of urinary incontinence; no biting of her tongue.  Patient does not have recollection of the events. She has a prior history of seizures when she was younger; the last episode was about seven years ago.  She also had a history of syncope in 1998.  She was seen and evaluated by Genene Churn. Love, M.D. with CAT scan of her head and an EEG which had  been normal, as per reports of Dyanne Carrel, M.D.  PAST MEDICAL HISTORY:  Seizures and migraines.  MEDICATIONS:  None.  ALLERGIES:  No known drug allergies.  HABITS:  Denies smoking or drinking.  SOCIAL HISTORY:  Married and has three children.  She works as an International aid/development worker in administration.  FAMILY HISTORY:  Father had a stroke.  PHYSICAL EXAMINATION:  VITAL SIGNS:  Blood pressure 142/86, pulse 80, respirations 19, temperature 98.2, oxygen saturation 97%.  HEENT:  Head normocephalic, atraumatic.  NECK:  Supple.  No bruits.  LUNGS:  Clear bilaterally.  HEART:  Regular rhythm.  No murmurs.  ABDOMEN:  Soft.  Bowel sounds present.  No tenderness.  No visceromegaly.  EXTREMITIES:  Positive pulses  bilaterally.  NEUROLOGIC:  Patient is awake and alert.  She is oriented to self and place. Speech is fluent but slow.  Pupils are equal, round and reactive bilaterally. Extraoculocephalic movements intact.  Her face is deviated towards the left side and partial twitching of the left corner of her mouth was noted during the examination.  Tongue in the midline.  Patient able to follow commands appropriately.  Motor examination:  Left hemiparesis; left upper extremity strength 3/5, left lower extremity 1-2/5.  There is a positive Hoovers sign.  DTRs +2. Plantars downgoing.  Sensory intact to touch and pinprick.  NEUROIMAGING:  I have personally reviewed a CT scan of the patients brain which is normal.  IMPRESSION: 1. Generalized seizure, partial seizure with secondary generalization. 2. Postictal paralysis. 3. Migraines.  PLAN AND RECOMMENDATIONS:  The diagnosis, condition and further interventions were discussed at length with the patients husband at the bedside.  Would like to load the patient on seizure medication, Dilantin, at a dose of 1300 mg intravenously  loading followed by 100 mg intravenously q.8h.  Further neurological workup is indicated at this time including electroencephalogram of the patients brain and  MRI of the brain with contrast for further evaluation.  Dr. Manus Gunning was notified of the patients admission to the hospital. DD:  03/09/99 TD:  03/09/99 Job: [id number]

## 2010-09-09 NOTE — Assessment & Plan Note (Signed)
[facility]                            CARDIOLOGY OFFICE NOTE   NAME:Baudoin, SHELLY SPENSER                    MRN:          [id number]  DATE:05/31/2006                            DOB:          [birth year]    Ms. Linda Castro is a 40 year old patient referred by [facility].   The patient was evaluated there on May 30, 2006 for chest pain.   The pain had started last Thursday, it was persistent and radiated to  the neck.  There was no associated diaphoresis.  There was some nausea.   The patient has no documented history of coronary artery disease.  The  patient has a history of hypertension but has been noncompliant with her  medications.   She had been placed on an Ace-inhibitor and Toprol previously but  decided to stop taking them.  I think she just did not like the idea of  taking medications.   FAMILY HISTORY:  Is positive for coronary disease.   The patient is a nondiabetic.  She does not smoke and lipid status is  unknown.   The patient continues to have some chest discomfort.  It is atypical  now, nonexertional.  There is a little bit of dyspnea associated with it  and she may very well have a URI.  A workup in the emergency room on  May 30, 2006 was unrevealing.  Her chest x-ray showed no active  disease.  EKG was normal and she ruled out for myocardial infarction.   REVIEW OF SYSTEMS:  Remarkable for significant lethargy.  She feels very  fatigued, even after a full night's sleep.   They did not check a TSH and T4 in the ER.   PAST SURGICAL HISTORY:  Includes 2 C-sections, lumpectomy and  appendectomy.   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  She has  been working for the same company for 8 years.  She is happily married,  she has 3 kids, age 6 to 31.  There is quite a lot going on at home.  Her mother passed in the last 3 months as did one of her spouse's family  members.   The  patient drinks 1/2 cup of coffee a day.  She does not have a lot of  outside activities.  She spends a lot of time at church, with her family  and with her children.   EXAMINATION:  Blood pressure is 148/88.  HEENT:  Is normal.  There is no thyromegaly, no lymphadenopathy, no carotid bruits.  LUNGS:  Are clear.  There is no S1, S2.  Normal heart sounds.  ABDOMEN:  Benign.  Lower extremity intact pulse.  No edema.   EKG is essentially normal.   IMPRESSION:  Hypertension.  The patient needs to watch the salt in her  diet and have increased physical activity.  I asked her which of the 2  medications had worked best for her blood pressure before and she seemed  to indicate the Toprol at a 50 mg dose did.  She will resume this.  She  will come  back to the office for routine treadmill test.  Since her EKG  is normal I do not think she needs a Myoview.  Her pain is atypical.  She ruled out for myocardial infarction and there is no EKG changes.  We  will do a 2-D echocardiogram to assess for occult congenital heart  disease and rule out regional wall motion abnormalities.  She will have  this prior to her treadmill test.  She can follow up with her primary  care MD in regards to follow up of her blood pressure as I did not  prescribe her initial medications.     Noralyn Pick. Eden Emms, MD, [facility]  Electronically Signed    PCN/MedQ  DD: 05/31/2006  DT: 05/31/2006  Job #: [id number]

## 2010-09-09 NOTE — Op Note (Signed)
[place]  Patient:    Linda Castro, Linda Castro                      MRN: [id number] Proc. Date: 11/14/99 Adm. Date:  [id number] Attending:  Janalyn Rouse                           Operative Report  PREOPERATIVE DIAGNOSIS:  Fibroadenoma of the right breast.  POSTOPERATIVE DIAGNOSIS:  Fibroadenoma of the right breast.  OPERATION:  Excision of fibroadenoma of the right breast.  SURGEON:  Dr. Francina Ames.  ANESTHESIA:  Monitored anesthesia care.  PROCEDURE IN DETAIL:  The patient was placed on the operating table with the right arm extended on the arm board.  The right breast was prepped and draped in a standard fashion.  The palpable movable nodule was at the 10:00 to 11:00 position, about an inch away from the areolar margin.  A small curvilinear incision was outlined and the area infiltrated with 1% Xylocaine with Adrenalin.  Incision was made with dissection down to the capsule of the fibroadenoma.  I placed a suture in that to lift it up and then excised it.  Specimen was submitted to the pathologist.  Hemostasis was obtained with the cautery.  The deeper breast tissue was approximated with 3-0 Vicryl and the skin with subcuticular 4-0 Monocryl and Steri-Strips.  Dressing was applied.  The patient was transferred to the recovery room in satisfactory condition, having tolerated the procedure well. DD:  11/14/99 TD:  11/15/99 Job: [id number]

## 2010-09-09 NOTE — Op Note (Signed)
[name], [name]             ACCOUNT NO.:  [id number]   MEDICAL RECORD NO.:  [id number]          PATIENT TYPE:  INP   LOCATION:  1823                         FACILITY:  [facility]   PHYSICIAN:  Sandria Bales. Ezzard Standing, M.D.  DATE OF BIRTH:  10/08/1970   DATE OF PROCEDURE:  02/07/2004  DATE OF DISCHARGE:                                 OPERATIVE REPORT   PREOPERATIVE DIAGNOSIS:  Acute appendicitis.   POSTOPERATIVE DIAGNOSIS:  Acute appendicitis.   PROCEDURE:  Laparoscopic appendectomy.   SURGEON:  Sandria Bales. Ezzard Standing, M.D.   ASSISTANT:  No first assistant.   ANESTHESIA:  General endotracheal.   ESTIMATED BLOOD LOSS:  Minimal.   INDICATIONS FOR PROCEDURE:  Ms. Linda Castro is a 40 year old black female who has  presented with about an 18 hr history of increasing lower abdominal pain,  localizing to the right lower quadrant.  A CT scan is consistent with acute  appendicitis with appendolith about one-third of the way down the appendix.   I discussed with the patient and her husband, the indications for the  potential complications of the surgery.  Potential complications include,  but are not limited to:  bleeding, infection, bowel injury and the  possibility of open surgery.  She now presents for attempt at laparoscopic  appendectomy.   DESCRIPTION OF PROCEDURE:  The patient had her left arm tucked. She was  given 3 g of Unasyn initially at beginning of procedure.  She had a Foley  catheter in place.  Her abdomen was prepped with Betadine solution and  sterilely draped.  She had an old infraumbilical incision, which I went  through into the abdominal cavity.  I placed a 12 mm Hasson trocar into the  abdominal cavity and secured it with a 0 Vicryl suture.  A  0 degree 10 mm scope was passed through the Hasson; the right and left lobes  of the liver are unremarkable, the anterior wall of the stomach  unremarkable.  The bowel that I could see was really unremarkable.  I got a  good look at her  cecum and terminal ileum.   I then placed a 5 mm trocar in the right upper quadrant, a 10 mm trocar in  the left lower quadrant.  I then was able to pull the appendix out of the  pelvis; actually it was fairly mobile.  The appendix matched very well to  the CT scan findings, where the distal two-thirds were turgid -- consistent  with acute appendicitis.  The proximal one-third appeared normal, which  means she probably did have an appendolith about one-third of the way down  the appendix.  I took down the mesentery using a harmonic scalpel.  I used a vascular Endo  GIA and fired across the base appendix, and I then placed the Endo catch bag  and delivered it through the umbilicus.   I then reinspected the mesentery of the appendix and the cecum.  There was  no bleeding, no obvious bowel injury.  I then removed the trocars and there  was no bleeding from any trocar site.  The umbilical trocar was  closed with  a 0  Vicryl suture.  The skin edges at each site were closed with a 5-0 Vicryl  suture, painted with tincture of Benzoin and Steri-Striped.   The patient tolerated the procedure well; was transported to the recovery  room in good condition.      [name]   DHN/MEDQ  D:  02/07/2004  T:  02/07/2004  Job:  [id number]   cc:   Sharlot Gowda, M.D.  [address]  [place], [place] [address]  Fax: [phone]

## 2010-09-09 NOTE — Procedures (Signed)
[place] [facility]                              EXERCISE TREADMILL   NAME:Linda Castro, Linda Castro                    MRN:          [id number]  DATE:06/07/2006                            DOB:          [birth year]    The patient exercised for into one minute in the stage III of the Bruce  protocol.   Exercise was stopped due to cramping in the left leg.   Heart rate increased from 103 to 152.  Blood pressure was elevated at  181/101.  Resting EKG was normal with stress.  There was no ischemia.   IMPRESSION:  Negative exercise stress test limited by cramping in the  left leg.  Hypertensive response to exercise.  The patient will continue  her Toprol.  We will add an ACE inhibitor.  We will follow her back up  in four weeks to reassess her blood pressure.     Noralyn Pick. Eden Emms, MD, [facility]  Electronically Signed    PCN/MedQ  DD: 06/07/2006  DT: 06/07/2006  Job #: [id number]

## 2010-09-09 NOTE — H&P (Signed)
Linda Castro, Linda Castro             ACCOUNT NO.:  [id number]   MEDICAL RECORD NO.:  [id number]          PATIENT TYPE:  INP   LOCATION:  1823                         FACILITY:  [facility]   PHYSICIAN:  Sandria Bales. Ezzard Standing, M.D.  DATE OF BIRTH:  May 23, 1970   DATE OF ADMISSION:  02/06/2004  DATE OF DISCHARGE:                                HISTORY & PHYSICAL   HISTORY OF PRESENT ILLNESS:  This is a 40 year old black female patient of  Linda Castro, M.D., who had some vague abdominal pain on Friday, February 05, 2004.  She went to a homecoming parade for [employer] yesterday, and at about 2:30  p.m. developed increasingly severe abdominal pain.  She had nausea and  vomiting, and had about six loose bowel movements.  Came to the [facility] Room about 10 pm on Saturday night, February 06, 2004.  A CT scan  ordered this morning reveals what is consistent with acute appendicitis.   The patient has no history of peptic ulcer disease, liver disease,  gallbladder disease, colon disease.  Her only prior abdominal surgery I  think is a tubal ligation.   PAST MEDICAL HISTORY:   ALLERGIES:  NONE.   CURRENT MEDICATIONS:  She is on no medications.   REVIEW OF SYSTEMS:  PULMONARY:  She does not smoke cigarettes.  No known  pneumonia or tuberculosis.  CARDIAC:  There is no heart disease or chest  pain.  GASTROINTESTINAL:  See history of present illness.  UROLOGIC:  No  acute kidney infections.  GYNECOLOGIC:  She is a gravida 3, para 3.   SOCIAL HISTORY:  She is accompanied by her husband in the emergency room.  She is an Designer, jewellery.   PHYSICAL EXAMINATION:  VITAL SIGNS:  Temperature 98.6, blood pressure  135/92, pulse 73, respirations 24.  GENERAL:  She is a well nourished black female who is uncomfortable.  HEENT:  Unremarkable.  NECK:  Supple; I feel no thyromegaly.  LUNGS:  Clear to auscultation.  HEART:  Has a regular rate and  rhythm; I hear no murmur or rub.  ABDOMEN:  Tender more in the right lower quadrant, but she has some guarding  and some rebound.  She has absent bowel sounds.  RECTAL:  I did not do a rectal examination on her.  EXTREMITIES:  She has good strength in all four extremities.  NEUROLOGIC:  Grossly intact.   LABORATORY DATA:  Sodium 139, potassium 3.2, chloride 104, BUN 13, glucose  121.  White blood count 11,800, with 89% neutrophils; hemoglobin 10.4,  hematocrit 31.9.  A urinalysis unremarkable.   I reviewed the CT scan with Bess Harvest, who read it; it does look as if she  has an inflamed appendix with a pendolith about one-third of the way down  the appendix.   DIAGNOSES:  1.  ACUTE APPENDICITIS.  I discussed with the patient and her husband about      proceeding with appendectomy at this time.  I discussed the indications      and potential complications.  The potential complications  include, but      are not limited to:  bleeding, infection, the need for open abdominal      surgery and the possibility of bowel injury.   1.  MILD ANEMIA.  With microcytic indices, probably that of iron deficiency      anemia.      [name]   DHN/MEDQ  D:  02/07/2004  T:  02/07/2004  Job:  [id number]   cc:   Linda Castro, M.D.  [address]  [place], [place] [address]  Fax: [phone]

## 2010-12-06 ENCOUNTER — Encounter: Payer: Self-pay | Admitting: Internal Medicine

## 2011-01-12 ENCOUNTER — Other Ambulatory Visit: Payer: Self-pay

## 2011-01-12 MED ORDER — DICLOFENAC POTASSIUM 50 MG PO TABS: 50.0000 mg | ORAL_TABLET | Freq: Two times a day (BID) | ORAL | Status: DC

## 2011-01-23 ENCOUNTER — Ambulatory Visit: Payer: 59 | Admitting: Internal Medicine

## 2011-08-15 ENCOUNTER — Other Ambulatory Visit: Payer: Self-pay | Admitting: Internal Medicine

## 2011-08-15 DIAGNOSIS — Z1231 Encounter for screening mammogram for malignant neoplasm of breast: Secondary | ICD-10-CM

## 2011-08-21 ENCOUNTER — Ambulatory Visit
Admission: RE | Admit: 2011-08-21 | Discharge: 2011-08-21 | Disposition: A | Discharge status: Home / Self Care | Payer: 59 | Source: Ambulatory Visit | Attending: Internal Medicine | Admitting: Internal Medicine

## 2011-08-21 DIAGNOSIS — Z1231 Encounter for screening mammogram for malignant neoplasm of breast: Secondary | ICD-10-CM

## 2011-10-13 ENCOUNTER — Emergency Department (HOSPITAL_COMMUNITY): Admission: EM | Admit: 2011-10-13 | Discharge: 2011-10-13 | Discharge status: Absent without leave | Payer: Self-pay

## 2012-05-08 ENCOUNTER — Ambulatory Visit: Payer: 59 | Attending: Otolaryngology | Admitting: Speech Pathology

## 2012-05-08 DIAGNOSIS — IMO0001 Reserved for inherently not codable concepts without codable children: Secondary | ICD-10-CM | POA: Insufficient documentation

## 2012-05-08 DIAGNOSIS — R490 Dysphonia: Secondary | ICD-10-CM | POA: Insufficient documentation

## 2012-05-13 ENCOUNTER — Ambulatory Visit: Payer: 59 | Admitting: Speech Pathology

## 2012-05-14 ENCOUNTER — Ambulatory Visit: Payer: 59

## 2012-05-15 ENCOUNTER — Ambulatory Visit: Payer: 59 | Admitting: Speech Pathology

## 2012-05-20 ENCOUNTER — Ambulatory Visit: Payer: 59 | Admitting: Speech Pathology

## 2012-05-22 ENCOUNTER — Ambulatory Visit: Payer: 59 | Admitting: Speech Pathology

## 2012-05-27 ENCOUNTER — Ambulatory Visit: Payer: 59 | Attending: Otolaryngology | Admitting: Speech Pathology

## 2012-05-27 DIAGNOSIS — R490 Dysphonia: Secondary | ICD-10-CM | POA: Insufficient documentation

## 2012-05-27 DIAGNOSIS — IMO0001 Reserved for inherently not codable concepts without codable children: Secondary | ICD-10-CM | POA: Insufficient documentation

## 2012-05-29 ENCOUNTER — Ambulatory Visit: Payer: 59 | Admitting: Speech Pathology

## 2012-06-03 ENCOUNTER — Ambulatory Visit: Payer: 59 | Admitting: Speech Pathology

## 2012-06-05 ENCOUNTER — Ambulatory Visit: Payer: 59 | Admitting: Speech Pathology

## 2012-06-10 ENCOUNTER — Ambulatory Visit: Payer: 59

## 2012-06-12 ENCOUNTER — Ambulatory Visit: Payer: 59 | Admitting: Speech Pathology

## 2012-06-17 ENCOUNTER — Ambulatory Visit: Payer: 59

## 2012-06-24 ENCOUNTER — Encounter: Payer: 59 | Admitting: Speech Pathology

## 2012-06-26 ENCOUNTER — Encounter: Payer: 59 | Admitting: Speech Pathology

## 2012-07-10 ENCOUNTER — Telehealth: Payer: Self-pay | Admitting: Genetic Counselor

## 2012-07-10 NOTE — Telephone Encounter (Signed)
S/W PT IN RE TO APPT CHANGE TO 06/09 @ 10. °NEW CALENDAR MAILED.  °

## 2012-07-11 ENCOUNTER — Telehealth: Payer: Self-pay | Admitting: Genetic Counselor

## 2012-07-11 ENCOUNTER — Encounter: Payer: 59 | Admitting: Genetic Counselor

## 2012-07-11 ENCOUNTER — Other Ambulatory Visit: Payer: 59

## 2012-07-11 NOTE — Telephone Encounter (Signed)
PT CALLED TO R/S GENETIC TESTING TO 06/09 @ 10.  NEW CALENDAR MAILED.

## 2012-09-30 ENCOUNTER — Other Ambulatory Visit: Payer: 59

## 2012-09-30 ENCOUNTER — Encounter: Payer: 59 | Admitting: Genetic Counselor

## 2013-02-19 ENCOUNTER — Other Ambulatory Visit: Payer: Self-pay

## 2013-02-19 DIAGNOSIS — Z1231 Encounter for screening mammogram for malignant neoplasm of breast: Secondary | ICD-10-CM

## 2013-04-11 ENCOUNTER — Ambulatory Visit: Payer: 59

## 2013-04-15 ENCOUNTER — Ambulatory Visit (INDEPENDENT_AMBULATORY_CARE_PROVIDER_SITE_OTHER): Payer: 59 | Admitting: Obstetrics and Gynecology

## 2013-04-15 ENCOUNTER — Encounter: Payer: Self-pay | Admitting: Obstetrics and Gynecology

## 2013-04-15 VITALS — BP 120/82 | HR 80 | Resp 16 | Ht 66.5 in | Wt 162.5 lb

## 2013-04-15 DIAGNOSIS — Z23 Encounter for immunization: Secondary | ICD-10-CM

## 2013-04-15 DIAGNOSIS — Z0000 Encounter for general adult medical examination without abnormal findings: Secondary | ICD-10-CM

## 2013-04-15 DIAGNOSIS — N920 Excessive and frequent menstruation with regular cycle: Secondary | ICD-10-CM

## 2013-04-15 DIAGNOSIS — Z01419 Encounter for gynecological examination (general) (routine) without abnormal findings: Secondary | ICD-10-CM

## 2013-04-15 LAB — COMPREHENSIVE METABOLIC PANEL
BUN: 10 mg/dL (ref 6–23)
CO2: 25 mEq/L (ref 19–32)
Calcium: 9.1 mg/dL (ref 8.4–10.5)
Creat: 0.69 mg/dL (ref 0.50–1.10)
Glucose, Bld: 93 mg/dL (ref 70–99)
Total Bilirubin: 0.4 mg/dL (ref 0.3–1.2)

## 2013-04-15 LAB — POCT URINALYSIS DIPSTICK
Bilirubin, UA: NEGATIVE
Blood, UA: NEGATIVE
Glucose, UA: NEGATIVE
Leukocytes, UA: NEGATIVE
Nitrite, UA: NEGATIVE
Protein, UA: NEGATIVE
Urobilinogen, UA: NEGATIVE
pH, UA: 5

## 2013-04-15 LAB — CBC
HCT: 33.9 % — ABNORMAL LOW (ref 36.0–46.0)
Hemoglobin: 10.5 g/dL — ABNORMAL LOW (ref 12.0–15.0)
MCH: 22.4 pg — ABNORMAL LOW (ref 26.0–34.0)
MCV: 72.4 fL — ABNORMAL LOW (ref 78.0–100.0)
RBC: 4.68 MIL/uL (ref 3.87–5.11)

## 2013-04-15 LAB — TSH: TSH: 1.016 u[IU]/mL (ref 0.350–4.500)

## 2013-04-15 LAB — LIPID PANEL
Cholesterol: 168 mg/dL (ref 0–200)
Total CHOL/HDL Ratio: 2.8 Ratio

## 2013-04-15 NOTE — Patient Instructions (Signed)

## 2013-04-15 NOTE — Progress Notes (Signed)
Patient ID: Linda Castro, female   DOB: 10-Mar-1971, 42 y.o.   MRN: [id number] GYNECOLOGY VISIT  PCP:   Rene Paci, MD  Referring provider:   HPI: 42 y.o.   Married  [place]  female   [phone] with Patient's last menstrual period was 03/23/2013.   here for   AEX.  Patient with bleeding in between menses for the last two months.  Bled for one week, stopped for 2 days, and then started again and bled for 2 more days.  Reporting a dull ache in bilateral pelvis, radiating to buttocks. This occurs one week prior to cycles beginning. Heavy flow for the first three days. Bleeding lasts 7 days.  History of uterine fibroids. Last ultrasound was about 09/19/07.  Night urination, 3 times per night. Voiding every hour or so. Some urgency to void. Leaks with singing and coughing.  Wearing a pad.   No prior evaluation or treatment.  Kegels make no improvement.   Family history of mother with peritoneal cancer.  Deceased in Sep 18, 2005.  She had already had had a hysterectomy. Patient's sister had genetic testing and was negative for BRCA testing.  Hgb:    Will do CBC. Urine:  Neg  GYNECOLOGIC HISTORY: Patient's last menstrual period was 03/23/2013. Sexually active:  yes Partner preference: female Contraception:   Tubal Ligation Menopausal hormone therapy: n/a DES exposure:   no Blood transfusions:   no Sexually transmitted diseases:   no GYN Procedures:  C-section x2, One VBAC with last pregnancy, tubal ligation  Mammogram:  04/2011 wnl:[facility].  Has appointment scheduled 05-12-13.               Pap:  09-19-2007 wnl  History of abnormal pap smear:  no   OB History   Grav Para Term Preterm Abortions TAB SAB Ect Mult Living   3 3 3       3        LIFESTYLE: Exercise:    no           Tobacco:   no Alcohol:     no Drug use:  no  OTHER HEALTH MAINTENANCE: Tetanus/TDap:   unsure Gardisil:              n/a Influenza:             01/2013 Zostavax:            n/a  Bone  density:     n/a Colonoscopy:     07/2007 wnl:Dr. Loreta Ave.  This was done because of rectal bleeding.  Cholesterol check:  Unsure   Family History  Problem Relation Age of Onset  . Colon cancer Other     grandparent  . Diabetes Other     parent, grandparent, other relative  . Hypertension Other     parent, other relative  . Stroke Other     parent, other relative  . Cancer Mother     peritoneal/?ovarian cells  . Ovarian cancer Mother     ?  Marland Kitchen Hypertension Mother   . Heart attack Father   . Hypertension Father   . Hyperlipidemia Father   . Seizures Father     epilepsy  . Diabetes Sister   . Colon cancer Maternal Grandmother     Patient Active Problem List   Diagnosis Date Noted  . BREAST CYST, RIGHT 03/10/2010  . 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 SYNCOPE 01/16/2010  . ANEMIA, IRON DEFICIENCY 09/08/2009  . HYPERTENSION 09/08/2009  . SEIZURE DISORDER  09/08/2009  . FATIGUE 09/08/2009   Past Medical History  Diagnosis Date  . ANEMIA, IRON DEFICIENCY 09/08/2009  . BREAST CYST, RIGHT 03/10/2010  . FATIGUE 09/08/2009  . HYPERTENSION 09/08/2009  . 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 SEIZURE DISORDER 09/08/2009  . SYNCOPE 01/16/2010  . Dyspareunia   . Dysmenorrhea   . Fibroid   . Urinary incontinence   . Fibroadenoma of right breast     Past Surgical History  Procedure Laterality Date  . Appendectomy  2006  . Breast surgery  2011    right--fibroadenoma removed  . Cesarean section  1989, 1993    x2  . Tubal ligation  1996    ALLERGIES: Review of patient's allergies indicates no known allergies.  Current Outpatient Prescriptions  Medication Sig Dispense Refill  . Multiple Vitamins-Iron (MULTIVITAMINS WITH IRON) TABS tablet Take 1 tablet by mouth daily.       No current facility-administered medications for this visit.     ROS:  Pertinent items are noted in HPI.  SOCIAL HISTORY:  Environmental health practitioner.   3 daughters, 2 are patients of mine.   PHYSICAL EXAMINATION:    BP 120/82   Pulse 80  Resp 16  Ht 5' 6.5" (1.689 m)  Wt 162 lb 8 oz (73.71 kg)  BMI 25.84 kg/m2  LMP 03/23/2013   Wt Readings from Last 3 Encounters:  04/15/13 162 lb 8 oz (73.71 kg)  03/10/10 155 lb (70.308 kg)  01/18/10 155 lb 12.8 oz (70.67 kg)     Ht Readings from Last 3 Encounters:  04/15/13 5' 6.5" (1.689 m)  03/10/10 5\' 7"  (1.702 m)  01/18/10 5\' 7"  (1.702 m)    General appearance: alert, cooperative and appears stated age Head: Normocephalic, without obvious abnormality, atraumatic Neck: no adenopathy, supple, symmetrical, trachea midline and thyroid not enlarged, symmetric, no tenderness/mass/nodules Lungs: clear to auscultation bilaterally Breasts: Right breast with semicircular incision - well healed, No nipple retraction or dimpling, No nipple discharge or bleeding, No axillary or supraclavicular adenopathy, Normal to palpation without dominant masses Heart: regular rate and rhythm Abdomen: soft, non-tender; mass to umbilicus minus one cm, mildly tender. Extremities: extremities normal, atraumatic, no cyanosis or edema Skin: Skin color, texture, turgor normal. No rashes or lesions Lymph nodes: Cervical, supraclavicular, and axillary nodes normal. No abnormal inguinal nodes palpated Neurologic: Grossly normal  Pelvic: External genitalia:  no lesions              Urethra:  normal appearing urethra with no masses, tenderness or lesions              Bartholins and Skenes: normal                 Vagina: normal appearing vagina with normal color and discharge, no lesions              Cervix: normal appearance              Pap and high risk HPV testing done: yes.            Bimanual Exam:  Uterus:  uterus is 19 week size, extending more to the right pelvis than the left pelvis, mildly tender.                                      Adnexa: ovaries are not palpated separately from the uterus.  Rectovaginal: Confirms                                       Anus:  normal sphincter tone, no lesions  ASSESSMENT  Probable uterine fibroids. Mixed incontinence. History of cesarean section times two. Family history of primary peritoneal cancer.  PLAN  Mammogram yearly. Pap smear and high risk HPV testing performed. Return for pelvic ultrasound and endometrial biopsy. ACOG handouts on urinary incontinence and hysterectomy. TDap today. Lipid profile, CBC, CMP, TSH. Return annually or prn   An After Visit Summary was printed and given to the patient.

## 2013-04-18 LAB — IPS PAP TEST WITH HPV

## 2013-04-25 ENCOUNTER — Telehealth: Payer: Self-pay | Admitting: Obstetrics and Gynecology

## 2013-04-25 NOTE — Telephone Encounter (Signed)
Return call to patient and advised of Dr Elza Rafter recommendation to at least proceed with partial evaluation with PUS and then discuss. Advised of collection of co-pay as an estimate. PUS scheduled for 05-01-13 at 1000.  Routing to provider for final review. Patient agreeable to disposition. Will close encounter

## 2013-04-25 NOTE — Telephone Encounter (Signed)
Patient is returning sallys call

## 2013-04-25 NOTE — Telephone Encounter (Signed)
Called to notify patient of her liability ($629.08) for pus and endo bx. Patient not able to pay right now. Will call back to schedule (maybe for March or April).

## 2013-04-25 NOTE — Telephone Encounter (Signed)
Message copied by Jaymes Graff on Fri Apr 25, 2013  2:45 PM ------      Message from: [place], BROOK E      Created: Fri Apr 25, 2013  2:28 PM       Gay Filler,            Please contact the patient from a clinical standpoint.            I would like to proceed with just the pelvic ultrasound.  Patient has a very large mass, which I believe is her uterus.  She has a family history of her mother with peritoneal cancer.             We can perhaps delay the endometrial biopsy, but I need to see the ultrasound results first.            Thanks.            Josefa Half                  ----- Message -----         From: Laqueta Due         Sent: 04/25/2013  10:20 AM           To: Brook E Amundson de Berton Lan, MD, #            Dr. Quincy Simmonds,            I called this patient to notify of her liability for pus and endo bx. The patient is not able to pay right now. She stated that she will call back to schedule (maybe for March or April). Is this acceptable?            Thank you,      Gabriel Cirri       ------

## 2013-04-25 NOTE — Telephone Encounter (Signed)
Call to patient regarding recommendation for PUS.  Per [place] with insurance dept, ultrasound alone will just be a co-pay (estimate). Once this is done, Dr Quincy Simmonds can discuss the endometrial biopsy deferment. LMTCB. VM has name confirmation.

## 2013-04-30 ENCOUNTER — Encounter: Payer: Self-pay | Admitting: Obstetrics and Gynecology

## 2013-05-01 ENCOUNTER — Encounter: Payer: Self-pay | Admitting: Obstetrics and Gynecology

## 2013-05-01 ENCOUNTER — Telehealth: Payer: Self-pay | Admitting: Obstetrics and Gynecology

## 2013-05-01 ENCOUNTER — Other Ambulatory Visit: Payer: Self-pay | Admitting: Obstetrics and Gynecology

## 2013-05-01 ENCOUNTER — Ambulatory Visit (INDEPENDENT_AMBULATORY_CARE_PROVIDER_SITE_OTHER): Payer: 59 | Admitting: Obstetrics and Gynecology

## 2013-05-01 ENCOUNTER — Ambulatory Visit (INDEPENDENT_AMBULATORY_CARE_PROVIDER_SITE_OTHER): Payer: 59

## 2013-05-01 VITALS — BP 120/70 | HR 70 | Ht 66.5 in | Wt 165.0 lb

## 2013-05-01 DIAGNOSIS — R9389 Abnormal findings on diagnostic imaging of other specified body structures: Secondary | ICD-10-CM

## 2013-05-01 DIAGNOSIS — N393 Stress incontinence (female) (male): Secondary | ICD-10-CM

## 2013-05-01 DIAGNOSIS — N920 Excessive and frequent menstruation with regular cycle: Secondary | ICD-10-CM

## 2013-05-01 DIAGNOSIS — D259 Leiomyoma of uterus, unspecified: Secondary | ICD-10-CM

## 2013-05-01 DIAGNOSIS — D219 Benign neoplasm of connective and other soft tissue, unspecified: Secondary | ICD-10-CM

## 2013-05-01 NOTE — Telephone Encounter (Signed)
Dr Quincy Simmonds would like to see patient back in 8 days for a consult.

## 2013-05-01 NOTE — Patient Instructions (Signed)

## 2013-05-01 NOTE — Telephone Encounter (Signed)
05-09-13, next Friday at 1030.  Appt entered, please notify patient.

## 2013-05-01 NOTE — Progress Notes (Signed)
Subjective  43 year old G72P3 - 2 cesarean sections and one VBAC Patient here for pelvic ultrasound.  Having bleeding after cycle is complete accompanied by cramping. Some constipation.   LMP 04/15/13 - 03/3013, then again 04/25/13 - 04/26/13.  Status post BTL.  Leaks with singing and coughing.  Mother had primary peritoneal cancer. Sister did BRCA testing - negative  Objective  BP 120/70   P 70  Ultrasound below - multiple fibroids 1.1 - 6.0 cm.  EMS thickened to 17.84 mm with 2 foci. Right ovary normal. Left ovary with 2 cm follicle. Mild clear free fluid.      Procedure - EMB Consent for procedure Graves speculum placed. Hibiclens to cervix. Tenaculum to anterior cervical lip. Pipelle passed to 11 cm twice. Tissue to pathology. No complications. Minimal EBL.  Assessment  Multifibroid uterus. Thickened endometrium Family history of primary peritoneal tumor Genuine stress incontinence. History of Cesarean section x 2.  Plan  Follow up EMB. I discussed TAH/BSO further with patient today.   I discussed stress incontinence and urodynamic testing to evaluate further. May consider midurethral sling if proceeds with hysterectomy.  Follow up in 8 days.   After visit summary to patient  15 minutes face to face time of which over 50% was spent in counseling.

## 2013-05-05 LAB — IPS CERVICAL/ECC/EMB/VULVAR/VAGINAL BIOPSY

## 2013-05-05 NOTE — Telephone Encounter (Signed)
Spoke with pt about appt 05-09-13 at 10:30. Pt saw notice on MyChart, and is appreciative.

## 2013-05-09 ENCOUNTER — Ambulatory Visit (INDEPENDENT_AMBULATORY_CARE_PROVIDER_SITE_OTHER): Payer: 59 | Admitting: Obstetrics and Gynecology

## 2013-05-09 ENCOUNTER — Encounter: Payer: Self-pay | Admitting: Obstetrics and Gynecology

## 2013-05-09 ENCOUNTER — Telehealth: Payer: Self-pay | Admitting: *Deleted

## 2013-05-09 VITALS — BP 146/88 | HR 87 | Resp 18 | Ht 66.5 in | Wt 163.8 lb

## 2013-05-09 DIAGNOSIS — D219 Benign neoplasm of connective and other soft tissue, unspecified: Secondary | ICD-10-CM

## 2013-05-09 DIAGNOSIS — N393 Stress incontinence (female) (male): Secondary | ICD-10-CM

## 2013-05-09 DIAGNOSIS — D259 Leiomyoma of uterus, unspecified: Secondary | ICD-10-CM

## 2013-05-09 NOTE — Progress Notes (Signed)
Patient ID: Linda Castro, female   DOB: 1971/02/24, 43 y.o.   MRN: [id number]  Subjective  Patient is here for further discussion of fibroids, benign EMB, and genuine stress incontinence. Husband is present for discussion.   Urinary incontinence for the last year or so.  Occurs with singing and dancing.   Status post BTL.  Declines future childbearing.  Status post Cesarean Section x 2.  Family history of mother with primary peritoneal tumor.   Patient's daughters are patients of mine.   Objective  No examination.  Assessment  Fibroid uterus. Genuine stress incontinence. Status post BTL. Status post Cesarean Section x 2. Family history of primary peritoneal tumor.  Plan  Proceed with TAH/BSO/possible cystoscopy.    I discussed in detail benefits and risks of surgery including bleeding, infection, damage to surrounding organs, DVT, PE, death, reaction to anesthesia, need for reoperation, menopausal symptoms and need for hormone therapy.  Declines incontinence procedure with midurethral sling after discussion of benefits and risks - sling exposure or erosion, urinary retention, dyspareunia, voiding dysfunction, need for reoperation.    35 minutes face to face time of which over 50% was spent in counseling.

## 2013-05-09 NOTE — Telephone Encounter (Signed)
VM has name confirmation, LM that I was calling with good report and to call back on Monday.

## 2013-05-09 NOTE — Patient Instructions (Signed)
We will call you after we talk to your insurance company.

## 2013-05-09 NOTE — Telephone Encounter (Signed)
Message copied by Jaymes Graff on Fri May 09, 2013  4:49 PM ------      Message from: [place], [place]: Wed May 07, 2013 11:01 AM       Gay Filler,            Please contact the patient regarding her endometrial biopsy which is negative for atypia and malignancy.      This patient is undergoing evaluation for total abdominal hysterectomy and bilateral salpingo-oophorectomy for an enlarged uterus with fibroids.      She has a family history of her mother with primary peritoneal tumor.      I believe I sent this to [facility] already for a precert.       Surgical time for her will be 2 1/2 - 3 hours.              Thanks. ------

## 2013-05-12 ENCOUNTER — Ambulatory Visit
Admission: RE | Admit: 2013-05-12 | Discharge: 2013-05-12 | Disposition: A | Discharge status: Home / Self Care | Payer: 59 | Source: Ambulatory Visit

## 2013-05-12 DIAGNOSIS — Z1231 Encounter for screening mammogram for malignant neoplasm of breast: Secondary | ICD-10-CM

## 2013-05-14 ENCOUNTER — Telehealth: Payer: Self-pay | Admitting: Emergency Medicine

## 2013-05-14 ENCOUNTER — Encounter: Payer: Self-pay | Admitting: Obstetrics and Gynecology

## 2013-05-14 NOTE — Telephone Encounter (Signed)
Thank you for scheduling the appointment.   

## 2013-05-14 NOTE — Telephone Encounter (Signed)
Spoke with patient and appointment scheduled for tomorrow with Dr. Quincy Simmonds. She states that Motrin q 4-6 hours is not helping with cramps. Advised of message below and patient agreeable.

## 2013-05-14 NOTE — Telephone Encounter (Signed)
From [place], MD [[id number]] To [name] Composed 05/14/2013 2:59 PM For Delivery On 05/14/2013 2:59 PM Subject RE: Non-Urgent Medical Question Message Type Patient Medical Advice Request Read Status Y By [name] - last viewed at 4:22 PM on 05/14/2013 Message Body Hi Donnalyn,   I will ask the triage nurse to contact you about your symptoms so we can see you need to come in for a brief visit.   Josefa Half, MD    ----- Message -----  From: [name]  Sent: 05/14/2013 11:27 AM EST  To: Tacy Learn, MD  Subject: Non-Urgent Medical Question   Hi Dr. Quincy Simmonds. I started my cycle 1/15 it was light but a lot of cramping been taking motrin On Monday night I started with very heavy bleeding and still a lot of cramping Is there anything else I can take the motrin does not seem to be helping I am just uncomfortable right now. Not sure if the pain is from the fibroids or my cycle. I normally try to not to complain but hate feeling uncomfortable.

## 2013-05-15 ENCOUNTER — Telehealth: Payer: Self-pay | Admitting: Obstetrics and Gynecology

## 2013-05-15 ENCOUNTER — Ambulatory Visit: Payer: 59 | Admitting: Obstetrics and Gynecology

## 2013-05-15 ENCOUNTER — Telehealth: Payer: Self-pay | Admitting: *Deleted

## 2013-05-15 NOTE — Telephone Encounter (Signed)
See next phone note regarding surgery date.

## 2013-05-15 NOTE — Telephone Encounter (Signed)
Patient called and cancelled her appointment for today with Dr. Quincy Simmonds due to discuss painful periods due to "not feeling well." Please call patient back to reschedule per her request.

## 2013-05-15 NOTE — Telephone Encounter (Signed)
Phone call to patient.  Patient having a sore throat and head cold symptoms so will not come in for a recheck today.   Patient is waiting for insurance benefits for hysterectomy.  Has bleeding and cramping. Taking ibuprofen 800 mg and no relief of symptoms, but is feeling better today.   Using a heating pad, which is helping.   Wants to proceed forward with hysterectomy.   I recommend iron sulfate po bid.  Currently taking once a day.   If surgery is delayed, may consider birth control pills.  Used in the remote past.  Patient is a nonsmoker.  Will sent message to precert department to see how progress is coming along.

## 2013-05-15 NOTE — Telephone Encounter (Signed)
Thanks for scheduling the patient's surgery.

## 2013-05-15 NOTE — Telephone Encounter (Signed)
Telephone patient, advised that insurance quoted patient liability for physicians fees for surgery is $1222.20. Also advised that the practice requires the payment in full at least 2 weeks prior to the surgery date.

## 2013-05-15 NOTE — Telephone Encounter (Signed)
Call to patient and notified of surgery scheduled for 06-24-13 at 0730 at [facility].  Surgical instructions reviewed with patient and mailed instruction sheet. Pre/post op appts scheduled.  Encouraged to call prn.  Routing to provider for final review. Patient agreeable to disposition. Will close encounter

## 2013-05-20 NOTE — Telephone Encounter (Signed)
Patient called regarding payment arrangement for surgery on 03.03.2015. Patient will send/bring in a payment of $400 and work towards paying off balance of $822.20 by payment due date 02.17.2015. Total due $1222.20//ssf

## 2013-05-21 ENCOUNTER — Encounter: Payer: Self-pay | Admitting: Obstetrics and Gynecology

## 2013-06-05 ENCOUNTER — Telehealth: Payer: Self-pay | Admitting: *Deleted

## 2013-06-05 NOTE — Telephone Encounter (Signed)
Call to patient and advised that surgery had to be rescheduled due to schedule conflict.  New date of 07-01-13 at 0950 at [facility].  Post op recheck appointments rescheduled accordingly.  Routing to provider for final review. Patient agreeable to disposition. Will close encounter

## 2013-06-12 ENCOUNTER — Ambulatory Visit (INDEPENDENT_AMBULATORY_CARE_PROVIDER_SITE_OTHER): Payer: 59 | Admitting: Obstetrics and Gynecology

## 2013-06-12 ENCOUNTER — Encounter: Payer: Self-pay | Admitting: Obstetrics and Gynecology

## 2013-06-12 VITALS — BP 130/76 | HR 80 | Ht 66.5 in | Wt 166.0 lb

## 2013-06-12 DIAGNOSIS — N6310 Unspecified lump in the right breast, unspecified quadrant: Secondary | ICD-10-CM

## 2013-06-12 DIAGNOSIS — N63 Unspecified lump in unspecified breast: Secondary | ICD-10-CM

## 2013-06-12 DIAGNOSIS — D259 Leiomyoma of uterus, unspecified: Secondary | ICD-10-CM

## 2013-06-12 DIAGNOSIS — D219 Benign neoplasm of connective and other soft tissue, unspecified: Secondary | ICD-10-CM

## 2013-06-12 NOTE — Progress Notes (Signed)
Patient ID: Linda Castro, female   DOB: Nov 26, 1970, 43 y.o.   MRN: [id number] GYNECOLOGY PROBLEM VISIT  PCP:   Tessie Fass, MD  Referring provider:   HPI: 43 y.o.   Married  [place] American  female   [phone] with Patient's last menstrual period was 06/06/2013.   here for  Consult for surgery.  Patient with bleeding in between cycles and has heavy mense accompanied by pelvic aching.  Declines future childbearing and desires hysterectomy with removal of tubes and ovaries.   Ultrasound 05/01/13 showed - multiple fibroids 1.1 - 6.0 cm. EMS thickened to 17.84 mm with 2 foci.  Right ovary normal.  Left ovary with 2 cm follicle.  Mild clear free fluid in cul de sac.  Endometrial biopsy 05/01/13 - benign early secretory endometrium without atypia and without malignancy.   Family history of primary peritoneal tumor.  Sister did BRCA testing, negative.  Notes a change under her right arm in the breast area.  Review of medical history -   Patient had a syncopal episode in 2011 after sang at a funeral.  No recurrence.  History of childhood seizures.  Not on any high blood pressure medication.   GYNECOLOGIC HISTORY: Patient's last menstrual period was 06/06/2013. Sexually active:  yes Partner preference: female Contraception:  tubal  Menopausal hormone therapy: no DES exposure:   no Blood transfusions:   no Sexually transmitted diseases:   no GYN procedures and prior surgeries:  C-section x2, tubal and one VBAC. Last mammogram: 05-12-13 wnl: [facility]                Last pap and high risk HPV testing:   04-15-13 wnl:neg HPV History of abnormal pap smear:  no   OB History   Grav Para Term Preterm Abortions TAB SAB Ect Mult Living   '3 3 3       3         ' Family History  Problem Relation Age of Onset  . Colon cancer Other     grandparent  . Diabetes Other     parent, grandparent, other relative  . Hypertension Other     parent, other relative  . Stroke Other      parent, other relative  . Cancer Mother     peritoneal/?ovarian cells  . Ovarian cancer Mother     ?  Marland Kitchen Hypertension Mother   . Heart attack Father   . Hypertension Father   . Hyperlipidemia Father   . Seizures Father     epilepsy  . Diabetes Sister   . Colon cancer Maternal Grandmother     Patient Active Problem List   Diagnosis Date Noted  . BREAST CYST, RIGHT 03/10/2010  . 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 SYNCOPE 01/16/2010  . ANEMIA, IRON DEFICIENCY 09/08/2009  . HYPERTENSION 09/08/2009  . SEIZURE DISORDER 09/08/2009  . FATIGUE 09/08/2009    Past Medical History  Diagnosis Date  . ANEMIA, IRON DEFICIENCY 09/08/2009  . BREAST CYST, RIGHT 03/10/2010  . FATIGUE 09/08/2009  . HYPERTENSION 09/08/2009  . 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 SEIZURE DISORDER 09/08/2009  . SYNCOPE 01/16/2010  . Dyspareunia   . Dysmenorrhea   . Fibroid   . Urinary incontinence   . Fibroadenoma of right breast     Past Surgical History  Procedure Laterality Date  . Appendectomy  2006  . Breast surgery  2011    right--fibroadenoma removed  . Cesarean section  1989, 1993    x2  . Tubal  ligation  1996    ALLERGIES: Hydrocodone  Current Outpatient Prescriptions  Medication Sig Dispense Refill  . Ferrous Sulfate (IRON) 325 (65 FE) MG TABS Take 1 tablet by mouth daily.      . Multiple Vitamins-Iron (MULTIVITAMINS WITH IRON) TABS tablet Take 1 tablet by mouth daily.       No current facility-administered medications for this visit.     ROS:  Pertinent items are noted in HPI.  SOCIAL HISTORY:  Married.   PHYSICAL EXAMINATION:    BP 130/76  Pulse 80  Ht 5' 6.5" (1.689 m)  Wt 166 lb (75.297 kg)  BMI 26.39 kg/m2  LMP 06/06/2013   Wt Readings from Last 3 Encounters:  06/12/13 166 lb (75.297 kg)  05/09/13 163 lb 12.8 oz (74.299 kg)  05/01/13 165 lb (74.844 kg)     Ht Readings from Last 3 Encounters:  06/12/13 5' 6.5" (1.689 m)  05/09/13 5' 6.5" (1.689 m)  05/01/13 5' 6.5" (1.689 m)    General  appearance: alert, cooperative and appears stated age Head: Normocephalic, without obvious abnormality, atraumatic Neck: no adenopathy, supple, symmetrical, trachea midline and thyroid not enlarged, symmetric, no tenderness/mass/nodules Lungs: clear to auscultation bilaterally Breasts: Inspection negative, No nipple retraction or dimpling, No nipple discharge or bleeding, No axillary or supraclavicular adenopathy, Normal to palpation without dominant masses on left breast.  Right breast with 1.5 cm mass at 10 - 11 o'clock position of perimeter of right breast.  Heart: regular rate and rhythm Abdomen: soft, non-tender; no masses,  no organomegaly Extremities: extremities normal, atraumatic, no cyanosis or edema Skin: Skin color, texture, turgor normal. No rashes or lesions Lymph nodes: Cervical, supraclavicular, and axillary nodes normal. No abnormal inguinal nodes palpated Neurologic: Grossly normal  Pelvic: External genitalia:  no lesions              Urethra:  normal appearing urethra with no masses, tenderness or lesions              Bartholins and Skenes: normal                 Vagina: normal appearing vagina with normal color and discharge, no lesions              Cervix: normal appearance                   Bimanual Exam:  Uterus:  uterus is 18 week size, irregular and slightly tender                                      Adnexa:  Difficult to palpate separately from the uterus.                                         ASSESSMENT  Right breast mass. Symptomatic fibroid uterus. History of Cesarean Section x 2.  Status post BTL. Family history of primary peritoneal tumor.   PLAN  Diagnostic right mammogram and ultrasound at [facility].  Proceed with TAH/BSO.  Risks, benefits, and alternatives reviewed with the patient who wishes to proceed.  I discussed the possibility of a supracervical hysterectomy.  Surgical recovery expectations have been reviewed.  I discussed hormone  therapy after hysterectomy, risks and benefits.  The patient would like to pursue this.    Return  prn.   An After Visit Summary was printed and given to the patient.  25 minutes of face to face time of which over 50% was spent in counseling.

## 2013-06-12 NOTE — Progress Notes (Signed)
Appointment made for R Dx Mammogram and R Breast US. Appointment made with patient in office for 06/30/13, patient requests this date as she plans to be off of work.  Patient agreeable to time/date/location.

## 2013-06-12 NOTE — Patient Instructions (Signed)
I will see you to day of surgery before your procedure begins.   You will receive the results of the diagnostic mammogram and ultrasound of the breast before you leave the facility.

## 2013-06-13 ENCOUNTER — Encounter (HOSPITAL_COMMUNITY): Payer: Self-pay | Admitting: Pharmacist

## 2013-06-17 ENCOUNTER — Telehealth: Payer: Self-pay | Admitting: Obstetrics and Gynecology

## 2013-06-17 NOTE — Telephone Encounter (Signed)
[facility]

## 2013-06-20 ENCOUNTER — Encounter (HOSPITAL_COMMUNITY): Payer: Self-pay

## 2013-06-20 ENCOUNTER — Encounter (HOSPITAL_COMMUNITY)
Admission: RE | Admit: 2013-06-20 | Discharge: 2013-06-20 | Disposition: A | Discharge status: Home / Self Care | Payer: 59 | Source: Ambulatory Visit | Attending: Obstetrics and Gynecology | Admitting: Obstetrics and Gynecology

## 2013-06-20 DIAGNOSIS — Z01812 Encounter for preprocedural laboratory examination: Secondary | ICD-10-CM | POA: Insufficient documentation

## 2013-06-20 LAB — CBC
HEMATOCRIT: 30.5 % — AB (ref 36.0–46.0)
HEMOGLOBIN: 9.5 g/dL — AB (ref 12.0–15.0)
MCH: 21.6 pg — AB (ref 26.0–34.0)
MCHC: 31.1 g/dL (ref 30.0–36.0)
MCV: 69.5 fL — AB (ref 78.0–100.0)
Platelets: 337 10*3/uL (ref 150–400)
RBC: 4.39 MIL/uL (ref 3.87–5.11)
RDW: 18 % — ABNORMAL HIGH (ref 11.5–15.5)
WBC: 6.5 10*3/uL (ref 4.0–10.5)

## 2013-06-20 NOTE — Patient Instructions (Signed)
Your procedure is scheduled on: Tuesday, July 01, 2013  Enter through the [employer] [facility] at: 8:15 AM  Pick up the phone at the desk and dial [phone].  Call this number if you have problems the morning of surgery: [phone].  Remember: Do NOT eat food: AFTER MIDNIGHT MONDAY Do NOT drink clear liquids after: AFTER MIDNIGHT MONDAY Take these medicines the morning of surgery with a SIP OF WATER: NONE  Do NOT wear jewelry (body piercing), make-up, or nail polish. Do NOT wear lotions, powders, or perfumes.  You may wear deoderant. Do NOT shave for 48 hours prior to surgery. Do NOT bring valuables to the hospital. Contacts, dentures, or bridgework may not be worn into surgery. Leave suitcase in car.  After surgery it may be brought to your room.  For patients admitted to the hospital, checkout time is 11:00 AM the day of discharge.

## 2013-06-23 NOTE — Telephone Encounter (Signed)
Return call to patient.  She is checking to see if FMLA forms completed.  Advised these forms can take up to two weeks for completion (especially with weather delays). Forms received on 06-12-13.  Advised will complete ASAP and call her.  Patient advised of CBC result as instructed by Dr Quincy Simmonds.  She states she received e-mail from Dr Quincy Simmonds to take iron TID.  Discussed taking with VIT C (such as OJ) and avoid taking with calcium foods (such as milk) to improve absorption.  Patient asking for clarification on fleet enema vs mag citrate bowel prep.  Advised of Dr Elza Rafter recommendation for fleet enema am of surgery. Patietn asking if she needs abdominal binder post op. Advised that this incision is just above pubic bone and probably does not need abdominal binder but will confirm and notify her when she picks up FMLA forms.

## 2013-06-23 NOTE — Telephone Encounter (Signed)
Patient calling to check on status of FMLA forms dropped off at last visit, 06/12/13.

## 2013-06-25 NOTE — Telephone Encounter (Signed)
Call to patient and left message (VM confirms name) that FMLA forms (including forms for STD) are completed and waiting at front desk for pickup.  Routing to provider for final review. Patient agreeable to disposition. Will close encounter

## 2013-06-30 ENCOUNTER — Ambulatory Visit
Admission: RE | Admit: 2013-06-30 | Discharge: 2013-06-30 | Disposition: A | Discharge status: Home / Self Care | Payer: 59 | Source: Ambulatory Visit | Attending: Obstetrics and Gynecology | Admitting: Obstetrics and Gynecology

## 2013-06-30 ENCOUNTER — Ambulatory Visit: Payer: 59 | Admitting: Obstetrics and Gynecology

## 2013-06-30 DIAGNOSIS — N6310 Unspecified lump in the right breast, unspecified quadrant: Secondary | ICD-10-CM

## 2013-07-01 ENCOUNTER — Encounter (HOSPITAL_COMMUNITY): Payer: Self-pay | Admitting: *Deleted

## 2013-07-01 ENCOUNTER — Inpatient Hospital Stay (HOSPITAL_COMMUNITY): Payer: 59 | Admitting: Anesthesiology

## 2013-07-01 ENCOUNTER — Encounter (HOSPITAL_COMMUNITY): Payer: 59 | Admitting: Anesthesiology

## 2013-07-01 ENCOUNTER — Encounter (HOSPITAL_COMMUNITY)
Admission: RE | Disposition: A | Discharge status: Home / Self Care | Payer: Self-pay | Source: Ambulatory Visit | Attending: Obstetrics and Gynecology

## 2013-07-01 ENCOUNTER — Inpatient Hospital Stay (HOSPITAL_COMMUNITY)
Admission: RE | Admit: 2013-07-01 | Discharge: 2013-07-03 | DRG: 743 | Disposition: A | Discharge status: Home / Self Care | Payer: 59 | Source: Ambulatory Visit | Attending: Obstetrics and Gynecology | Admitting: Obstetrics and Gynecology

## 2013-07-01 DIAGNOSIS — R32 Unspecified urinary incontinence: Secondary | ICD-10-CM | POA: Diagnosis present

## 2013-07-01 DIAGNOSIS — Z9079 Acquired absence of other genital organ(s): Secondary | ICD-10-CM

## 2013-07-01 DIAGNOSIS — Z90710 Acquired absence of both cervix and uterus: Secondary | ICD-10-CM

## 2013-07-01 DIAGNOSIS — N63 Unspecified lump in unspecified breast: Secondary | ICD-10-CM | POA: Diagnosis present

## 2013-07-01 DIAGNOSIS — D259 Leiomyoma of uterus, unspecified: Secondary | ICD-10-CM

## 2013-07-01 DIAGNOSIS — D249 Benign neoplasm of unspecified breast: Secondary | ICD-10-CM | POA: Diagnosis present

## 2013-07-01 DIAGNOSIS — IMO0002 Reserved for concepts with insufficient information to code with codable children: Secondary | ICD-10-CM | POA: Diagnosis present

## 2013-07-01 DIAGNOSIS — N946 Dysmenorrhea, unspecified: Secondary | ICD-10-CM | POA: Diagnosis present

## 2013-07-01 DIAGNOSIS — N840 Polyp of corpus uteri: Secondary | ICD-10-CM | POA: Diagnosis present

## 2013-07-01 DIAGNOSIS — D251 Intramural leiomyoma of uterus: Principal | ICD-10-CM | POA: Diagnosis present

## 2013-07-01 DIAGNOSIS — Z90722 Acquired absence of ovaries, bilateral: Secondary | ICD-10-CM

## 2013-07-01 DIAGNOSIS — N838 Other noninflammatory disorders of ovary, fallopian tube and broad ligament: Secondary | ICD-10-CM | POA: Diagnosis present

## 2013-07-01 DIAGNOSIS — I10 Essential (primary) hypertension: Secondary | ICD-10-CM | POA: Diagnosis present

## 2013-07-01 DIAGNOSIS — D649 Anemia, unspecified: Secondary | ICD-10-CM | POA: Diagnosis present

## 2013-07-01 HISTORY — PX: ABDOMINAL HYSTERECTOMY: SHX81

## 2013-07-01 LAB — TYPE AND SCREEN
ABO/RH(D): A POS
ANTIBODY SCREEN: NEGATIVE

## 2013-07-01 LAB — PREGNANCY, URINE: PREG TEST UR: NEGATIVE

## 2013-07-01 LAB — ABO/RH: ABO/RH(D): A POS

## 2013-07-01 SURGERY — HYSTERECTOMY, ABDOMINAL
Anesthesia: General | Site: Abdomen | Laterality: Bilateral

## 2013-07-01 MED ORDER — HYDROMORPHONE HCL PF 1 MG/ML IJ SOLN
0.2500 mg | INTRAMUSCULAR | Status: DC | PRN
  Administered 2013-07-01 (×4): 0.5 mg via INTRAVENOUS

## 2013-07-01 MED ORDER — HYDROMORPHONE HCL PF 1 MG/ML IJ SOLN
INTRAMUSCULAR | Status: AC
  Administered 2013-07-01: 0.5 mg via INTRAVENOUS
  Filled 2013-07-01: qty 1

## 2013-07-01 MED ORDER — ONDANSETRON HCL 4 MG/2ML IJ SOLN
4.0000 mg | Freq: Four times a day (QID) | INTRAMUSCULAR | Status: DC | PRN
  Administered 2013-07-01 – 2013-07-02 (×3): 4 mg via INTRAVENOUS
  Filled 2013-07-01 (×3): qty 2

## 2013-07-01 MED ORDER — FENTANYL CITRATE 0.05 MG/ML IJ SOLN
INTRAMUSCULAR | Status: AC
  Filled 2013-07-01: qty 5

## 2013-07-01 MED ORDER — LACTATED RINGERS IV SOLN
INTRAVENOUS | Status: DC
  Administered 2013-07-01: 09:00:00 via INTRAVENOUS

## 2013-07-01 MED ORDER — ONDANSETRON HCL 4 MG/2ML IJ SOLN
INTRAMUSCULAR | Status: AC
Start: 2013-07-01 — End: 2013-07-01
  Filled 2013-07-01: qty 2

## 2013-07-01 MED ORDER — GLYCOPYRROLATE 0.2 MG/ML IJ SOLN
INTRAMUSCULAR | Status: DC | PRN
  Administered 2013-07-01: 0.1 mg via INTRAVENOUS
  Administered 2013-07-01: 0.6 mg via INTRAVENOUS

## 2013-07-01 MED ORDER — EPHEDRINE 5 MG/ML INJ
INTRAVENOUS | Status: AC
  Filled 2013-07-01: qty 10

## 2013-07-01 MED ORDER — MENTHOL 3 MG MT LOZG
1.0000 | LOZENGE | OROMUCOSAL | Status: DC | PRN
  Administered 2013-07-02: 3 mg via ORAL
  Filled 2013-07-01: qty 9

## 2013-07-01 MED ORDER — DIPHENHYDRAMINE HCL 12.5 MG/5ML PO ELIX
12.5000 mg | ORAL_SOLUTION | Freq: Four times a day (QID) | ORAL | Status: DC | PRN
  Administered 2013-07-02: 12.5 mg via ORAL
  Filled 2013-07-01: qty 5

## 2013-07-01 MED ORDER — DEXTROSE 5 % IV SOLN
2.0000 g | INTRAVENOUS | Status: AC
  Administered 2013-07-01: 2 g via INTRAVENOUS
  Filled 2013-07-01: qty 2

## 2013-07-01 MED ORDER — KETOROLAC TROMETHAMINE 30 MG/ML IJ SOLN
INTRAMUSCULAR | Status: AC
  Filled 2013-07-01: qty 1

## 2013-07-01 MED ORDER — METHYLENE BLUE 1 % INJ SOLN
INTRAMUSCULAR | Status: AC
  Filled 2013-07-01: qty 10

## 2013-07-01 MED ORDER — NALOXONE HCL 0.4 MG/ML IJ SOLN: 0.4000 mg | INTRAMUSCULAR | Status: DC | PRN

## 2013-07-01 MED ORDER — DEXAMETHASONE SODIUM PHOSPHATE 10 MG/ML IJ SOLN
INTRAMUSCULAR | Status: DC | PRN
  Administered 2013-07-01: 10 mg via INTRAVENOUS

## 2013-07-01 MED ORDER — PROPOFOL 10 MG/ML IV EMUL
INTRAVENOUS | Status: AC
  Filled 2013-07-01: qty 20

## 2013-07-01 MED ORDER — LIDOCAINE HCL (CARDIAC) 20 MG/ML IV SOLN
INTRAVENOUS | Status: AC
  Filled 2013-07-01: qty 5

## 2013-07-01 MED ORDER — LACTATED RINGERS IV SOLN
INTRAVENOUS | Status: DC
  Administered 2013-07-01: 125 mL/h via INTRAVENOUS
  Administered 2013-07-01 (×2): via INTRAVENOUS

## 2013-07-01 MED ORDER — MIDAZOLAM HCL 2 MG/2ML IJ SOLN
INTRAMUSCULAR | Status: AC
  Filled 2013-07-01: qty 2

## 2013-07-01 MED ORDER — METOCLOPRAMIDE HCL 5 MG/ML IJ SOLN: 10.0000 mg | Freq: Once | INTRAMUSCULAR | Status: DC | PRN

## 2013-07-01 MED ORDER — KETOROLAC TROMETHAMINE 30 MG/ML IJ SOLN
INTRAMUSCULAR | Status: DC | PRN
  Administered 2013-07-01: 30 mg via INTRAVENOUS

## 2013-07-01 MED ORDER — ACETAMINOPHEN 500 MG PO TABS
ORAL_TABLET | ORAL | Status: AC
  Filled 2013-07-01: qty 2

## 2013-07-01 MED ORDER — FENTANYL CITRATE 0.05 MG/ML IJ SOLN
INTRAMUSCULAR | Status: AC
  Filled 2013-07-01: qty 2

## 2013-07-01 MED ORDER — MIDAZOLAM HCL 2 MG/2ML IJ SOLN
INTRAMUSCULAR | Status: DC | PRN
  Administered 2013-07-01: 2 mg via INTRAVENOUS

## 2013-07-01 MED ORDER — ACETAMINOPHEN 160 MG/5ML PO SOLN
975.0000 mg | Freq: Once | ORAL | Status: AC
  Administered 2013-07-01: 975 mg via ORAL

## 2013-07-01 MED ORDER — MEPERIDINE HCL 25 MG/ML IJ SOLN: 6.2500 mg | INTRAMUSCULAR | Status: DC | PRN

## 2013-07-01 MED ORDER — PROPOFOL 10 MG/ML IV BOLUS
INTRAVENOUS | Status: DC | PRN
  Administered 2013-07-01: 180 mg via INTRAVENOUS
  Administered 2013-07-01: 20 mg via INTRAVENOUS

## 2013-07-01 MED ORDER — ONDANSETRON HCL 4 MG/2ML IJ SOLN
INTRAMUSCULAR | Status: DC | PRN
  Administered 2013-07-01: 4 mg via INTRAVENOUS

## 2013-07-01 MED ORDER — NEOSTIGMINE METHYLSULFATE 1 MG/ML IJ SOLN
INTRAMUSCULAR | Status: DC | PRN
  Administered 2013-07-01: 3 mg via INTRAVENOUS

## 2013-07-01 MED ORDER — ACETAMINOPHEN 500 MG PO TABS
1000.0000 mg | ORAL_TABLET | Freq: Once | ORAL | Status: AC
  Administered 2013-07-01: 1000 mg via ORAL

## 2013-07-01 MED ORDER — HYDROMORPHONE HCL PF 1 MG/ML IJ SOLN
0.2500 mg | INTRAMUSCULAR | Status: DC | PRN
  Administered 2013-07-01 (×3): 0.5 mg via INTRAVENOUS

## 2013-07-01 MED ORDER — ROCURONIUM BROMIDE 100 MG/10ML IV SOLN
INTRAVENOUS | Status: AC
  Filled 2013-07-01: qty 1

## 2013-07-01 MED ORDER — KETOROLAC TROMETHAMINE 30 MG/ML IJ SOLN
30.0000 mg | Freq: Four times a day (QID) | INTRAMUSCULAR | Status: AC
  Administered 2013-07-01 – 2013-07-02 (×4): 30 mg via INTRAVENOUS
  Filled 2013-07-01 (×4): qty 1

## 2013-07-01 MED ORDER — ROCURONIUM BROMIDE 100 MG/10ML IV SOLN
INTRAVENOUS | Status: DC | PRN
  Administered 2013-07-01: 10 mg via INTRAVENOUS
  Administered 2013-07-01: 40 mg via INTRAVENOUS
  Administered 2013-07-01: 10 mg via INTRAVENOUS

## 2013-07-01 MED ORDER — GLYCOPYRROLATE 0.2 MG/ML IJ SOLN
INTRAMUSCULAR | Status: AC
  Filled 2013-07-01: qty 2

## 2013-07-01 MED ORDER — HYDROMORPHONE HCL PF 1 MG/ML IJ SOLN
INTRAMUSCULAR | Status: AC
  Filled 2013-07-01: qty 1

## 2013-07-01 MED ORDER — LIDOCAINE HCL (CARDIAC) 20 MG/ML IV SOLN
INTRAVENOUS | Status: DC | PRN
  Administered 2013-07-01: 50 mg via INTRAVENOUS

## 2013-07-01 MED ORDER — EPHEDRINE SULFATE 50 MG/ML IJ SOLN
INTRAMUSCULAR | Status: DC | PRN
  Administered 2013-07-01: 10 mg via INTRAVENOUS

## 2013-07-01 MED ORDER — IBUPROFEN 600 MG PO TABS
600.0000 mg | ORAL_TABLET | Freq: Four times a day (QID) | ORAL | Status: DC | PRN
  Administered 2013-07-02 – 2013-07-03 (×3): 600 mg via ORAL
  Filled 2013-07-01 (×3): qty 1

## 2013-07-01 MED ORDER — DEXAMETHASONE SODIUM PHOSPHATE 10 MG/ML IJ SOLN
INTRAMUSCULAR | Status: AC
  Filled 2013-07-01: qty 1

## 2013-07-01 MED ORDER — SODIUM CHLORIDE 0.9 % IJ SOLN: 9.0000 mL | INTRAMUSCULAR | Status: DC | PRN

## 2013-07-01 MED ORDER — HYDROMORPHONE 0.3 MG/ML IV SOLN
INTRAVENOUS | Status: DC
  Administered 2013-07-01: 16:00:00 via INTRAVENOUS
  Administered 2013-07-01: 0.4 mg via INTRAVENOUS
  Administered 2013-07-01: 0.599 mg via INTRAVENOUS
  Administered 2013-07-02: 0.999 mg via INTRAVENOUS
  Administered 2013-07-02: 0.799 mg via INTRAVENOUS
  Filled 2013-07-01: qty 25

## 2013-07-01 MED ORDER — GLYCOPYRROLATE 0.2 MG/ML IJ SOLN
INTRAMUSCULAR | Status: AC
  Filled 2013-07-01: qty 1

## 2013-07-01 MED ORDER — ACETAMINOPHEN 160 MG/5ML PO SOLN
ORAL | Status: AC
  Administered 2013-07-01: 975 mg via ORAL
  Filled 2013-07-01: qty 40.6

## 2013-07-01 MED ORDER — KETOROLAC TROMETHAMINE 30 MG/ML IJ SOLN: 15.0000 mg | Freq: Once | INTRAMUSCULAR | Status: DC | PRN

## 2013-07-01 MED ORDER — DIPHENHYDRAMINE HCL 50 MG/ML IJ SOLN
12.5000 mg | Freq: Four times a day (QID) | INTRAMUSCULAR | Status: DC | PRN
  Administered 2013-07-02: 12.5 mg via INTRAVENOUS
  Filled 2013-07-01: qty 1

## 2013-07-01 MED ORDER — FENTANYL CITRATE 0.05 MG/ML IJ SOLN
INTRAMUSCULAR | Status: DC | PRN
  Administered 2013-07-01: 100 ug via INTRAVENOUS
  Administered 2013-07-01 (×2): 50 ug via INTRAVENOUS
  Administered 2013-07-01: 100 ug via INTRAVENOUS
  Administered 2013-07-01 (×2): 50 ug via INTRAVENOUS
  Administered 2013-07-01: 100 ug via INTRAVENOUS

## 2013-07-01 MED ORDER — ONDANSETRON HCL 4 MG PO TABS: 4.0000 mg | ORAL_TABLET | Freq: Four times a day (QID) | ORAL | Status: DC | PRN

## 2013-07-01 MED ORDER — NEOSTIGMINE METHYLSULFATE 1 MG/ML IJ SOLN
INTRAMUSCULAR | Status: AC
  Filled 2013-07-01: qty 1

## 2013-07-01 MED ORDER — LACTATED RINGERS IV SOLN
INTRAVENOUS | Status: DC
  Administered 2013-07-01 – 2013-07-02 (×2): via INTRAVENOUS

## 2013-07-01 SURGICAL SUPPLY — 39 items
CANISTER SUCT 3000ML (MISCELLANEOUS) ×3 IMPLANT
CLOTH BEACON ORANGE TIMEOUT ST (SAFETY) ×3 IMPLANT
DRAPE HYSTEROSCOPY (DRAPE) ×2 IMPLANT
DRSG OPSITE POSTOP 4X10 (GAUZE/BANDAGES/DRESSINGS) ×2 IMPLANT
DRSG TELFA 3X8 NADH (GAUZE/BANDAGES/DRESSINGS) ×3 IMPLANT
GLOVE BIO SURGEON STRL SZ 6.5 (GLOVE) ×3 IMPLANT
GLOVE BIOGEL M 6.5 STRL (GLOVE) ×4 IMPLANT
GLOVE BIOGEL PI IND STRL 6.5 (GLOVE) ×3 IMPLANT
GLOVE BIOGEL PI IND STRL 7.0 (GLOVE) ×6 IMPLANT
GLOVE BIOGEL PI INDICATOR 6.5 (GLOVE) ×3
GLOVE BIOGEL PI INDICATOR 7.0 (GLOVE) ×6
GLOVE SURG SS PI 7.0 STRL IVOR (GLOVE) ×10 IMPLANT
GOWN STRL REUS W/TWL LRG LVL3 (GOWN DISPOSABLE) ×12 IMPLANT
NS IRRIG 1000ML POUR BTL (IV SOLUTION) ×3 IMPLANT
PACK ABDOMINAL GYN (CUSTOM PROCEDURE TRAY) ×3 IMPLANT
PACK VAGINAL WOMENS (CUSTOM PROCEDURE TRAY) ×3 IMPLANT
PAD ABD 7.5X8 STRL (GAUZE/BANDAGES/DRESSINGS) ×2 IMPLANT
PAD DRESSING TELFA 3X8 NADH (GAUZE/BANDAGES/DRESSINGS) IMPLANT
PAD OB MATERNITY 4.3X12.25 (PERSONAL CARE ITEMS) ×3 IMPLANT
PROTECTOR NERVE ULNAR (MISCELLANEOUS) ×5 IMPLANT
SET CYSTO W/LG BORE CLAMP LF (SET/KITS/TRAYS/PACK) ×5 IMPLANT
SPONGE GAUZE 4X4 12PLY STER LF (GAUZE/BANDAGES/DRESSINGS) ×2 IMPLANT
SPONGE LAP 18X18 X RAY DECT (DISPOSABLE) ×8 IMPLANT
STAPLER VISISTAT 35W (STAPLE) ×5 IMPLANT
SUT PLAIN 2 0 (SUTURE) ×3
SUT PLAIN ABS 2-0 CT1 27XMFL (SUTURE) ×1 IMPLANT
SUT VIC AB 0 CT1 18XCR BRD8 (SUTURE) ×5 IMPLANT
SUT VIC AB 0 CT1 27 (SUTURE) ×9
SUT VIC AB 0 CT1 27XBRD ANBCTR (SUTURE) ×6 IMPLANT
SUT VIC AB 0 CT1 8-18 (SUTURE) ×9
SUT VIC AB 2-0 CT1 27 (SUTURE) ×3
SUT VIC AB 2-0 CT1 TAPERPNT 27 (SUTURE) ×2 IMPLANT
SUT VIC AB 2-0 SH 27 (SUTURE) ×9
SUT VIC AB 2-0 SH 27XBRD (SUTURE) ×6 IMPLANT
SUT VIC AB 4-0 PS2 27 (SUTURE) ×2 IMPLANT
SUT VICRYL 0 TIES 12 18 (SUTURE) ×3 IMPLANT
TOWEL OR 17X24 6PK STRL BLUE (TOWEL DISPOSABLE) ×6 IMPLANT
TRAY FOLEY CATH 14FR (SET/KITS/TRAYS/PACK) ×3 IMPLANT
WATER STERILE IRR 1000ML POUR (IV SOLUTION) ×1 IMPLANT

## 2013-07-01 NOTE — H&P (Signed)
Patient ID: Linda Castro, female   DOB: [birth year], 43 y.o.   MRN: [id number] GYNECOLOGY PROBLEM VISIT  PCP:   Tessie Fass, MD  Referring provider:   HPI: 43 y.o.   Married  [place] American  female    [phone] with Patient's last menstrual period was 06/06/2013.    here for  Consult for surgery.   Patient with bleeding in between cycles and has heavy mense accompanied by pelvic aching.   Declines future childbearing and desires hysterectomy with removal of tubes and ovaries.   Ultrasound 05/01/13 showed - multiple fibroids 1.1 - 6.0 cm. EMS thickened to 17.84 mm with 2 foci.   Right ovary normal.   Left ovary with 2 cm follicle.   Mild clear free fluid in cul de sac.  Endometrial biopsy 05/01/13 - benign early secretory endometrium without atypia and without malignancy.   Family history of primary peritoneal tumor.  Sister did BRCA testing, negative.  Notes a change under her right arm in the breast area.  Review of medical history -   Patient had a syncopal episode in 2011 after sang at a funeral.  No recurrence.  History of childhood seizures.  Not on any high blood pressure medication.   GYNECOLOGIC HISTORY: Patient's last menstrual period was 06/06/2013. Sexually active:  yes Partner preference: female Contraception:  tubal   Menopausal hormone therapy: no DES exposure:   no Blood transfusions:   no Sexually transmitted diseases:   no GYN procedures and prior surgeries:  C-section x2, tubal and one VBAC. Last mammogram: 05-12-13 wnl: [facility]                 Last pap and high risk HPV testing:   04-15-13 wnl:neg HPV History of abnormal pap smear:  no    OB History     Grav  Para  Term  Preterm  Abortions  TAB  SAB  Ect  Mult  Living     '3  3  3              3             ' Family History   Problem  Relation  Age of Onset   .  Colon cancer  Other         grandparent   .  Diabetes  Other         parent, grandparent, other relative   .   Hypertension  Other         parent, other relative   .  Stroke  Other         parent, other relative   .  Cancer  Mother         peritoneal/?ovarian cells   .  Ovarian cancer  Mother         ?   Marland Kitchen  Hypertension  Mother     .  Heart attack  Father     .  Hypertension  Father     .  Hyperlipidemia  Father     .  Seizures  Father         epilepsy   .  Diabetes  Sister     .  Colon cancer  Maternal Grandmother         Patient Active Problem List     Diagnosis  Date Noted   .  BREAST CYST, RIGHT  03/10/2010   .  MASTITIS  03/10/2010   .  SYNCOPE  01/16/2010   .  ANEMIA, IRON DEFICIENCY  09/08/2009   .  HYPERTENSION  09/08/2009   .  SEIZURE DISORDER  09/08/2009   .  FATIGUE  09/08/2009       Past Medical History   Diagnosis  Date   .  ANEMIA, IRON DEFICIENCY  09/08/2009   .  BREAST CYST, RIGHT  03/10/2010   .  FATIGUE  09/08/2009   .  HYPERTENSION  09/08/2009   .  MASTITIS  03/10/2010   .  SEIZURE DISORDER  09/08/2009   .  SYNCOPE  01/16/2010   .  Dyspareunia     .  Dysmenorrhea     .  Fibroid     .  Urinary incontinence     .  Fibroadenoma of right breast         Past Surgical History   Procedure  Laterality  Date   .  Appendectomy    2006   .  Breast surgery    2011       right--fibroadenoma removed   .  Cesarean section    1989, 1993       x2   .  Tubal ligation    1996     ALLERGIES: Hydrocodone    Current Outpatient Prescriptions   Medication  Sig  Dispense  Refill   .  Ferrous Sulfate (IRON) 325 (65 FE) MG TABS  Take 1 tablet by mouth daily.         .  Multiple Vitamins-Iron (MULTIVITAMINS WITH IRON) TABS tablet  Take 1 tablet by mouth daily.            No current facility-administered medications for this visit.      ROS:  Pertinent items are noted in HPI.  SOCIAL HISTORY:  Married.   PHYSICAL EXAMINATION:    BP 130/76  Pulse 80  Ht 5' 6.5" (1.689 m)  Wt 166 lb (75.297 kg)  BMI 26.39 kg/m2  LMP 06/06/2013    Wt Readings from Last 3 Encounters:    06/12/13  166 lb (75.297 kg)   05/09/13  163 lb 12.8 oz (74.299 kg)   05/01/13  165 lb (74.844 kg)       Ht Readings from Last 3 Encounters:   06/12/13  5' 6.5" (1.689 m)   05/09/13  5' 6.5" (1.689 m)   05/01/13  5' 6.5" (1.689 m)     General appearance: alert, cooperative and appears stated age Head: Normocephalic, without obvious abnormality, atraumatic Neck: no adenopathy, supple, symmetrical, trachea midline and thyroid not enlarged, symmetric, no tenderness/mass/nodules Lungs: clear to auscultation bilaterally Breasts: Inspection negative, No nipple retraction or dimpling, No nipple discharge or bleeding, No axillary or supraclavicular adenopathy, Normal to palpation without dominant masses on left breast.  Right breast with 1.5 cm mass at 10 - 11 o'clock position of perimeter of right breast.   Heart: regular rate and rhythm Abdomen: soft, non-tender; no masses,  no organomegaly Extremities: extremities normal, atraumatic, no cyanosis or edema Skin: Skin color, texture, turgor normal. No rashes or lesions Lymph nodes: Cervical, supraclavicular, and axillary nodes normal. No abnormal inguinal nodes palpated Neurologic: Grossly normal  Pelvic: External genitalia:  no lesions              Urethra:  normal appearing urethra with no masses, tenderness or lesions              Bartholins and Skenes: normal  Vagina: normal appearing vagina with normal color and discharge, no lesions              Cervix: normal appearance                    Bimanual Exam:  Uterus:  uterus is 18 week size, irregular and slightly tender                                      Adnexa:  Difficult to palpate separately from the uterus.                                           ASSESSMENT  Right breast mass. Symptomatic fibroid uterus. History of Cesarean Section x 2.   Status post BTL. Family history of primary peritoneal tumor.   PLAN  Diagnostic right mammogram and ultrasound at  [facility].   Proceed with TAH/BSO.  Risks, benefits, and alternatives reviewed with the patient who wishes to proceed.   I discussed the possibility of a supracervical hysterectomy.  Surgical recovery expectations have been reviewed.   I discussed hormone therapy after hysterectomy, risks and benefits.  The patient would like to pursue this.      Return prn.    An After Visit Summary was printed and given to the patient.  25 minutes of face to face time of which over 50% was spent in counseling.

## 2013-07-01 NOTE — Progress Notes (Signed)
Day of Surgery Procedure(s) (LRB): TOTAL ABDOMINAL HYSTERECTOMY WITH BILATERAL SALPINGO OOPHORECTOMY (Bilateral)  Subjective: Patient reports incisional pain.   On low dose dilaudid PCA and Toradol.  Objective: I have reviewed patient's vital signs and intake and output.  General: Sleepy, appropriate conversation.  Resp: clear to auscultation bilaterally Cardio: regular rate and rhythm, S1, S2 normal, no murmur, click, rub or gallop GI: soft, non-tender; bowel sounds normal; no masses,  no organomegaly and incision: clean, dry and intact Extremities:  PAS and ted hose on.  Vaginal Bleeding: none  Assessment: s/p Procedure(s): TOTAL ABDOMINAL HYSTERECTOMY WITH BILATERAL SALPINGO OOPHORECTOMY (Bilateral): stable  Plan: Foley overnight.  CBC and BMP in am. Reviewed surgical findings and procedure.   LOS: 0 days    Linda Castro 07/01/2013, 5:28 PM

## 2013-07-01 NOTE — Brief Op Note (Signed)
07/01/2013  12:08 PM  PATIENT:  Linda Castro  43 y.o. female  PRE-OPERATIVE DIAGNOSIS:  uterine fibroids  POST-OPERATIVE DIAGNOSIS:  uterine fibroids  PROCEDURE:  Procedure(s): TOTAL ABDOMINAL HYSTERECTOMY WITH BILATERAL SALPINGO OOPHORECTOMY (Bilateral)  SURGEON:  Surgeon(s) and Role:    * Amairany Schumpert E Amundson de Berton Lan, MD - Primary    * Azalia Bilis, MD - Assisting  PHYSICIAN ASSISTANT:   ASSISTANTS: Elveria Rising, MD   ANESTHESIA:   general  EBL:  Total I/O In: 2000 [I.V.:2000] Out: 550 [Urine:500; Blood:50]  BLOOD ADMINISTERED:none  DRAINS: Urinary Catheter (Foley)   LOCAL MEDICATIONS USED:  NONE  SPECIMEN:  Source of Specimen:   Uterus, cervix, bilateral tubes and ovaries  DISPOSITION OF SPECIMEN:  PATHOLOGY  COUNTS:  YES  TOURNIQUET:  * No tourniquets in log *  DICTATION: .Other Dictation: Dictation Number    PLAN OF CARE: Admit to inpatient   PATIENT DISPOSITION:  PACU - hemodynamically stable.   Delay start of Pharmacological VTE agent (>24hrs) due to surgical blood loss or risk of bleeding: not applicable

## 2013-07-01 NOTE — Transfer of Care (Signed)
Immediate Anesthesia Transfer of Care Note  Patient: Linda Castro  Procedure(s) Performed: Procedure(s): TOTAL ABDOMINAL HYSTERECTOMY WITH BILATERAL SALPINGO OOPHORECTOMY (Bilateral)  Patient Location: PACU  Anesthesia Type:General  Level of Consciousness: sedated  Airway & Oxygen Therapy: Patient Spontanous Breathing and Patient connected to nasal cannula oxygen  Post-op Assessment: Report given to PACU RN and Post -op Vital signs reviewed and stable  Post vital signs: stable  Complications: No apparent anesthesia complications

## 2013-07-01 NOTE — Anesthesia Preprocedure Evaluation (Signed)
Anesthesia Evaluation  Patient identified by MRN, date of birth, ID band Patient awake    Reviewed: Allergy & Precautions, H&P , NPO status , Patient's Chart, lab work & pertinent test results, reviewed documented beta blocker date and time   History of Anesthesia Complications Negative for: history of anesthetic complications  Airway Mallampati: II TM Distance: >3 FB Neck ROM: full    Dental  (+) Teeth Intact   Pulmonary neg pulmonary ROS,  breath sounds clear to auscultation  Pulmonary exam normal       Cardiovascular Exercise Tolerance: Good hypertension (no longer on meds), Rhythm:regular Rate:Normal     Neuro/Psych negative neurological ROS  negative psych ROS   GI/Hepatic negative GI ROS, Neg liver ROS,   Endo/Other  negative endocrine ROS  Renal/GU negative Renal ROS  Female GU complaint     Musculoskeletal   Abdominal   Peds  Hematology  (+) anemia , hgb 9.5   Anesthesia Other Findings   Reproductive/Obstetrics negative OB ROS                           Anesthesia Physical Anesthesia Plan  ASA: II  Anesthesia Plan: General ETT   Post-op Pain Management:    Induction:   Airway Management Planned:   Additional Equipment:   Intra-op Plan:   Post-operative Plan:   Informed Consent: I have reviewed the patients History and Physical, chart, labs and discussed the procedure including the risks, benefits and alternatives for the proposed anesthesia with the patient or authorized representative who has indicated his/her understanding and acceptance.   Dental Advisory Given  Plan Discussed with: CRNA and Surgeon  Anesthesia Plan Comments:         Anesthesia Quick Evaluation

## 2013-07-01 NOTE — Progress Notes (Signed)
Update to History and Physical  Patient had a diagnostic mammogram and ultrasound of the breast which showed a benign stable known fibroadenoma.  Type and screen performed today due to anemia.  Hgb 9.5 pre-op. Patient is taking FeSO4 three times a day with meals.  Patient examined.   OK to proceed.

## 2013-07-01 NOTE — Anesthesia Postprocedure Evaluation (Signed)
  Anesthesia Post-op Note  Patient: Linda Castro  Procedure(s) Performed: Procedure(s): TOTAL ABDOMINAL HYSTERECTOMY WITH BILATERAL SALPINGO OOPHORECTOMY (Bilateral)  Patient is awake and responsive. Pain and nausea are reasonably well controlled. Vital signs are stable and clinically acceptable. Oxygen saturation is clinically acceptable. There are no apparent anesthetic complications at this time. Patient is ready for discharge.

## 2013-07-02 ENCOUNTER — Encounter (HOSPITAL_COMMUNITY): Payer: Self-pay | Admitting: Obstetrics and Gynecology

## 2013-07-02 LAB — CBC
HCT: 30.2 % — ABNORMAL LOW (ref 36.0–46.0)
Hemoglobin: 9.2 g/dL — ABNORMAL LOW (ref 12.0–15.0)
MCH: 21.7 pg — ABNORMAL LOW (ref 26.0–34.0)
MCHC: 30.5 g/dL (ref 30.0–36.0)
MCV: 71.2 fL — ABNORMAL LOW (ref 78.0–100.0)
PLATELETS: 201 10*3/uL (ref 150–400)
RBC: 4.24 MIL/uL (ref 3.87–5.11)
RDW: 20.6 % — AB (ref 11.5–15.5)
WBC: 9.7 10*3/uL (ref 4.0–10.5)

## 2013-07-02 LAB — BASIC METABOLIC PANEL
BUN: 9 mg/dL (ref 6–23)
CALCIUM: 8.5 mg/dL (ref 8.4–10.5)
CO2: 28 mEq/L (ref 19–32)
CREATININE: 0.73 mg/dL (ref 0.50–1.10)
Chloride: 102 mEq/L (ref 96–112)
Glucose, Bld: 113 mg/dL — ABNORMAL HIGH (ref 70–99)
Potassium: 4 mEq/L (ref 3.7–5.3)
Sodium: 138 mEq/L (ref 137–147)

## 2013-07-02 MED ORDER — HYDROMORPHONE HCL 2 MG PO TABS
2.0000 mg | ORAL_TABLET | ORAL | Status: DC | PRN
  Administered 2013-07-02 – 2013-07-03 (×5): 2 mg via ORAL
  Filled 2013-07-02 (×5): qty 1

## 2013-07-02 MED ORDER — ESTRADIOL 0.05 MG/24HR TD PTWK
0.0500 mg | MEDICATED_PATCH | TRANSDERMAL | Status: DC
  Administered 2013-07-02: 0.05 mg via TRANSDERMAL
  Filled 2013-07-02: qty 1

## 2013-07-02 NOTE — Addendum Note (Signed)
Addendum created 07/02/13 0945 by Lenox Ponds, CRNA   Modules edited: Notes Section   Notes Section:  File: [id number]

## 2013-07-02 NOTE — Op Note (Signed)
NAMEKAYLIA, WINBORNE NO.:  [id number]  MEDICAL RECORD NO.:  [id number]  LOCATION:  9302                          FACILITY:  [facility]  PHYSICIAN:  Lenard Galloway, M.D.   DATE OF BIRTH:  17-Apr-1971  DATE OF PROCEDURE:  07/01/2013 DATE OF DISCHARGE:                              OPERATIVE REPORT   PREOPERATIVE DIAGNOSIS:  Symptomatic uterine fibroids.  POSTOPERATIVE DIAGNOSIS:  Symptomatic uterine fibroids.  PROCEDURES:  Total abdominal hysterectomy with bilateral salpingo- oophorectomy.  SURGEON:  Lenard Galloway, M.D.  ASSISTANT:  Alver Sorrow. Lathrop, MD  ANESTHESIA:  General endotracheal.  IV FLUIDS:  2000 mL lactated Ringer's.  ESTIMATED BLOOD LOSS:  50 mL.  URINE OUTPUT:  500 mL.  COMPLICATIONS:  None.  INDICATIONS FOR THE PROCEDURE:  The patient is a 43 year old gravida 3, para 3-0-0-3 African American female, who presented with heavy menstrual bleeding and intermenstrual bleeding accompanied by pelvic aching.  On pelvic examination, the patient was noted to have an 18-week size irregular uterus.  Pelvic ultrasound confirmed the presence of multiple uterine fibroids.  The endometrium was thickened with a measurement of 17.84 mm with two foci.  The left ovary demonstrated a 2-cm follicle and the right ovary was normal.  An endometrial biopsy documented benign early secretory endometrium.  The patient has had a tubal ligation, and she is declining any future childbearing, and she requests hysterectomy with removal of tubes and ovaries.  The patient's mother had a history of a probable primary peritoneal tumor.  Examination under anesthesia revealed a 20-week size mobile uterus.  The adnexa were not palpated separately from the uterus.  Laparotomy demonstrated a uterine fundus which was filled with intramural fibroids.  The tubes were consistent with a prior tubal ligation and the bilateral ovaries were unremarkable.  There was no evidence of any  endometriosis in the pelvis.  There was some scarring along the vesicouterine fold from the patient's prior cesarean sections.  In the upper abdomen, the liver, gallbladder, bilateral kidneys, and periaortic regions were unremarkable.  The appendix was surgically absent.  SPECIMEN:  The uterus.  The uterine fundus, bilateral tubes and ovaries were removed in total.  The cervix was removed separately.  All specimens were sent to Pathology together.  DESCRIPTION OF PROCEDURE:  The patient was reidentified in the preoperative hold area.  The patient received cefotetan 2 g IV for antibiotic prophylaxis.  She received TED hose and PAS stockings for DVT prophylaxis.  In the operating room, the patient was placed in the supine position on the operating room table and the general endotracheal anesthesia was induced.  The patient was then placed in the dorsal lithotomy position with Allen stirrups.  The abdomen was prepped with ChloraPrep and the vagina and perineum were prepped with Betadine.  The patient was sterilely draped.  A Foley catheter was placed in the bladder and left to gravity drainage throughout the procedure.  An examination under anesthesia was performed.  The procedure began by creating a Pfannenstiel incision with a scalpel along the patient's previous upper Pfannenstiel incision, as there were two.  The incision was carried down to the fascia using monopolar cautery for hemostasis.  The fascia  was then incised in the midline with a scalpel.  The fascia was then opened bilaterally with the Mayo scissors.  Sharp dissection was used to dissect the rectus muscles and aponeurosis off the fascia superiorly and inferiorly.  The peritoneal cavity was sharply entered high.  The peritoneal incision was then extended cranially and caudally.  An exploration of the pelvis and abdomen was performed at this time. The uterus was delivered through the incision.  Two moistened lap  pads were placed in the pelvis to retract bowel out of the surgical field and the patient was placed in Trendelenburg.  Long Kelly clamps were placed across the adnexal structures bilaterally. The right round ligament was then grasped with a Babcock clamp and was suture ligated with a transfixing suture of 0 Vicryl.  The round ligament was then bisected with monopolar cautery.  The broad leaf was opened anteriorly and posteriorly using monopolar cautery.  Dissection in the broad ligament allowed identification of the ureter on the patient's right-hand side by palpation.  This was very deep.  A leaf was then created in the posterior leaf of the broad ligament and the infundibulopelvic ligament was doubly clamped, sharply divided, and suture ligated with a free tie of 0 Vicryl followed by suture ligature of the same.  The same procedure that was performed on the right-hand side was then repeated on the left-hand side with respect to the round ligament and the infundibulopelvic ligament.  On the patient's left-hand side, the ureter was visualized directly prior to the clamping and cutting the infundibulopelvic ligament.  Sharp dissection of the vesicouterine fold to bring the bladder off the anterior lower uterine segment was next performed.  There was some thickened scarring in the midline, but this dissection was performed without difficulty.  Hemostasis during the dissection along the anterior lower uterine segment was performed with monopolar cautery.  The dissection itself was performed with a Metzenbaum scissors.  Each of the uterine arteries were skeletonized.  The uterine arteries were then doubly clamped, sharply divided, and suture ligated with two sutures of 0 Vicryl on each side.  Hemostasis was good.  Straight Heaney clamps were then used to clamp the inferior aspects of the cardinal ligaments bilaterally.  The pedicles were created sharply with a scalpel, and the pedicles  were then sutured with 0 Vicryl bilaterally.  At this point, the uterine fundus was amputated from the cervix using monopolar cautery and the specimen was set aside.  The uterosacral ligaments were then clamped, sharply divided, and suture ligated with transfixing sutures of 0 Vicryl.  Entry into the vagina at this time was possible on the patient's left-hand side, and the Mayo scissors was used to circumscribe the cervix from the vaginal apex.  The cervix was then sent to Pathology with the remaining specimen.  Modified Richardson angle sutures were then created with a 0 Vicryl bilaterally.  The remainder of the vaginal cuff was closed with figure-of-eight sutures of 0 Vicryl.  Hemostasis was good.  The pelvis was irrigated and suctioned.  There was a small amount of oozing on the visceral peritoneum of the bladder where the dissection had occurred between the bladder and the uterus.  This responded well to monopolar cautery.  There was also some minor oozing over the bladder dome which responded to a very brief monopolar cautery treatment.  All of the pedicles were examined at this time and found to be hemostatic.  The ureters were each noted to peristalse well.  The lap  pads were removed from the peritoneal cavity.  The abdomen was closed.  The parietal peritoneum was closed with a running suture of 2-0 Vicryl.  The rectus muscles and aponeurosis were brought together in the midline using interrupted sutures of 0 Vicryl. The fascia was closed with a running suture of 0 Vicryl.  The subcutaneous layer was irrigated, suctioned, and made hemostatic with monopolar cautery.  Interrupted sutures of 2-0 plain were placed in the subcutaneous layer.  The skin was closed with a subcuticular suture of 4- 0 Vicryl.  A pressure dressing was placed over the incision.  This concluded the patient's procedure.  There were no complications. The needle, instrument, and sponge counts were tested  for and correct by the OR staff.     Lenard Galloway, M.D.     BES/MEDQ  D:  07/01/2013  T:  07/02/2013  Job:  [id number]

## 2013-07-02 NOTE — Anesthesia Postprocedure Evaluation (Signed)
  Anesthesia Post-op Note  Patient: Linda Castro  Procedure(s) Performed: Procedure(s): TOTAL ABDOMINAL HYSTERECTOMY WITH BILATERAL SALPINGO OOPHORECTOMY (Bilateral)  Patient Location: [facility]  Anesthesia Type:General  Level of Consciousness: awake, alert  and oriented  Airway and Oxygen Therapy: Patient Spontanous Breathing  Post-op Pain: mild  Post-op Assessment: Patient's Cardiovascular Status Stable, Patent Airway, No signs of Nausea or vomiting, Adequate PO intake and Pain level controlled  Post-op Vital Signs: stable  Complications: No apparent anesthesia complications

## 2013-07-02 NOTE — Progress Notes (Signed)
1 Day Post-Op Procedure(s) (LRB): TOTAL ABDOMINAL HYSTERECTOMY WITH BILATERAL SALPINGO OOPHORECTOMY (Bilateral)  Subjective: Patient reports incisional pain.   Foley out.  No void yet. Hungry.  Ambulated once last hs.   Objective: I have reviewed patient's vital signs, intake and output and labs. Hgb 9.2.  General: alert Resp: clear to auscultation bilaterally Cardio: regular rate and rhythm, S1, S2 normal, no murmur, click, rub or gallop GI: soft, non-tender; bowel sounds normal; no masses,  no organomegaly and incision:  Dressing clean, dry, intact.  Vaginal Bleeding: minimal  Assessment: s/p Procedure(s): TOTAL ABDOMINAL HYSTERECTOMY WITH BILATERAL SALPINGO OOPHORECTOMY (Bilateral): stable  Plan: Advance diet Encourage ambulation Advance to PO medication  Start estrogen therapy.  Discussed with patient prior to surgery and again this am.  Surgical findings and procedure again discussed.  LOS: 1 day    Amundson de Berton Lan 07/02/2013, 8:00 AM

## 2013-07-03 ENCOUNTER — Telehealth: Payer: Self-pay | Admitting: Obstetrics and Gynecology

## 2013-07-03 MED ORDER — HYDROMORPHONE HCL 2 MG PO TABS: 2.0000 mg | ORAL_TABLET | ORAL | Status: DC | PRN

## 2013-07-03 MED ORDER — ESTRADIOL 0.05 MG/24HR TD PTWK: 0.0500 mg | MEDICATED_PATCH | TRANSDERMAL | Status: DC

## 2013-07-03 MED ORDER — IBUPROFEN 600 MG PO TABS: 600.0000 mg | ORAL_TABLET | Freq: Four times a day (QID) | ORAL | Status: DC | PRN

## 2013-07-03 NOTE — Telephone Encounter (Signed)
Patient had surgery on Tuesday was discharged today from hospital. Patient has had diarrhea all day since she has been home. Every time she goes to bathroom. didnt know if it was normal or if there was a problem

## 2013-07-03 NOTE — Telephone Encounter (Signed)
Thank you :)

## 2013-07-03 NOTE — Progress Notes (Signed)
Pt discharged to home with husband.  Condition stable.  Pt ambulated to car with RN.  No equipment for home ordered at discharge. 

## 2013-07-03 NOTE — Telephone Encounter (Signed)
Have patient stop taking prune juice and anything with caffeine in it.  She will need to hydrate very, very well. If the diarrhea continues through the night, I would like the patient to come to the office tomorrow to give a stool sample to rule out C difficile enterocolitis.

## 2013-07-03 NOTE — Telephone Encounter (Signed)
Spoke with patient and message from Dr. Quincy Simmonds given. Instructions for hydration, continue with fluids, jello, ginger ale, Gatorade and water. To call back in the morning with update and can schedule stool culture if continues with diarrhea. Advised to call back to our office with any worsening symptoms, fevers, chills, abdominal pain, unable to hold fluids. Patient agreeable to instructions and will call back in the morning with update and ask for [facility].

## 2013-07-03 NOTE — Discharge Instructions (Signed)
Hysterectomy °Care After °Refer to this sheet in the next few weeks. These instructions provide you with information on caring for yourself after your procedure. Your caregiver may also give you more specific instructions. Your treatment has been planned according to current medical practices, but problems sometimes occur. Call your caregiver if you have any problems or questions after your procedure. °HOME CARE INSTRUCTIONS  °Healing will take time. You may have discomfort, tenderness, swelling, and bruising at the surgical site for about 2 weeks. This is normal and will get better as time goes on. °· Only take over-the-counter or prescription medicines for pain, discomfort, or fever as directed by your caregiver. °· Do not take aspirin. It can cause bleeding. °· Do not drive when taking pain medicine. °· Follow your caregiver's advice regarding exercise, lifting, driving, and general activities. °· Resume your usual diet as directed and allowed. °· Get plenty of rest and sleep. °· Do not douche, use tampons, or have sexual intercourse for at least 6 weeks or until your caregiver gives you permission. °· Change your bandages (dressings) as directed by your caregiver. °· Monitor your temperature. °· Take showers instead of baths for 2 to 3 weeks. °· Do not drink alcohol until your caregiver gives you permission. °· If you are constipated, you may take a mild laxative with your caregiver's permission. Bran foods may help with constipation problems. Drinking enough fluids to keep your urine clear or pale yellow may help as well. °· Try to have someone home with you for 1 or 2 weeks to help around the house. °· Keep all of your follow-up appointments as directed by your caregiver. °SEEK MEDICAL CARE IF:  °· You have swelling, redness, or increasing pain in the surgical cut (incision) area. °· You have pus coming from the incision. °· You notice a bad smell coming from the incision or dressing. °· You have swelling,  redness, or pain around the intravenous (IV) site. °· Your incision breaks open. °· You feel dizzy or lightheaded. °· You have pain or bleeding when you urinate. °· You have persistent diarrhea. °· You have persistent nausea and vomiting. °· You have abnormal vaginal discharge. °· You have a rash. °· You have any type of abnormal reaction or develop an allergy to your medicine. °· Your pain is not controlled with your prescribed medicine. °SEEK IMMEDIATE MEDICAL CARE IF:  °· You have a fever. °· You have severe abdominal pain. °· You have chest pain. °· You have shortness of breath. °· You faint. °· You have pain, swelling, or redness of your leg. °· You have heavy vaginal bleeding with blood clots. °MAKE SURE YOU: °· Understand these instructions. °· Will watch your condition. °· Will get help right away if you are not doing well or get worse. °Document Released: 10/28/2004 Document Revised: 07/03/2011 Document Reviewed: 11/25/2010 °[employer]® Patient Information ©2014 [employer]. ° °

## 2013-07-03 NOTE — Telephone Encounter (Signed)
Spoke with patient. She was recently dc from [facility]. Completed Enema on Monday night and had a BM and had another BM on Tuesday morning prior to surgery.  Patient states she started passing flatus this morning and was seen by Dr. Quincy Simmonds. Ate breakfast and has been having loose stools since breakfast. She has eaten lunch as well and is drinking water but states "it comes right back out." Estimates 4 episodes of loose stool total. No nausea, no fevers (checked temperature while on phone it was 98.8 orally), denies abdominal pain but does have "cramping" at onset of loose stool which then resolves. Last took pain medication today at 2:30. Advised would obtain advise from Dr. Quincy Simmonds and return call. Patient agreeable.

## 2013-07-03 NOTE — Progress Notes (Signed)
2 Days Post-Op Procedure(s) (LRB): TOTAL ABDOMINAL HYSTERECTOMY WITH BILATERAL SALPINGO OOPHORECTOMY (Bilateral)  Subjective: Patient reports right mandible pain.  Feels like TMJ. No flatus yet. Ambulating. Voiding. Good control of pain with oral Dilaudid.   Objective: I have reviewed patient's vital signs and intake and output.  General: alert GI: soft, non-tender; bowel sounds normal; no masses,  no organomegaly and incision: clean, intact and  mild mucously drainage. Mandible - no masses, slight tenderness to palpation of right mandible laterally.   Pathology report - benign fibroids and benign endometrial polyp.  Benign tubes and ovaries.  Assessment: s/p Procedure(s): TOTAL ABDOMINAL HYSTERECTOMY WITH BILATERAL SALPINGO OOPHORECTOMY (Bilateral): progressing well and  Ready for discharge. 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  Plan: Discharge home  Instructions and precautions given. Prescription for dilaudid po and motrin. Follow up in one week.   LOS: 2 days    Linda Castro 07/03/2013, 7:01 AM

## 2013-07-04 ENCOUNTER — Encounter: Payer: Self-pay | Admitting: Obstetrics and Gynecology

## 2013-07-04 ENCOUNTER — Ambulatory Visit (INDEPENDENT_AMBULATORY_CARE_PROVIDER_SITE_OTHER): Payer: 59 | Admitting: Obstetrics and Gynecology

## 2013-07-04 VITALS — BP 136/80 | HR 56 | Temp 98.1°F | Ht 66.5 in | Wt 161.0 lb

## 2013-07-04 DIAGNOSIS — R197 Diarrhea, unspecified: Secondary | ICD-10-CM

## 2013-07-04 MED ORDER — ONDANSETRON HCL 4 MG PO TABS: 4.0000 mg | ORAL_TABLET | Freq: Three times a day (TID) | ORAL | Status: DC | PRN

## 2013-07-04 NOTE — Telephone Encounter (Signed)
Return call to patient, she states diarrhea has resolved. She is tolerating clear liquids, denies nausea or vomiting.  Denies fever. Pain well controlled.  Bladder function normal.  Reports feeling distended and complains of gas pain.  Discussed warm liquids and frequent position changes. Heating pad on low, precautions given. Need to update [place] at lunchtime and see if continuing to improve.  Patient agreeable.  Dr Quincy Simmonds, update on patient this am.  Anything else?

## 2013-07-04 NOTE — Telephone Encounter (Signed)
Returning a call to Tracy °

## 2013-07-04 NOTE — Telephone Encounter (Signed)
Patient instructed to come to office now. Husband will drive her.  Encounter closed.

## 2013-07-04 NOTE — Patient Instructions (Signed)
Keep your appointment for Monday.  Call if you develop fever, vomiting, abdominal distention, or increasing abdominal pain.   Hydrate well.  Avoid caffeine.  Try the BRAT diet - bananas, rice, apples, and toast.

## 2013-07-04 NOTE — Telephone Encounter (Signed)
Call from pateint. She is not feeling better. Has tried coffee and dry toast and then chicken broth and gatorade.  Reports that every time she tries to eat, had very watery diarrhea. Three stools since 0930 today. Experienced slight nausea recently but continues to deny vomiting or fever. Has passed some flatus but does not feel abdomen is any less distended, thinks it is actually more distended. Advised will check with Dr Quincy Simmonds and call her back.

## 2013-07-04 NOTE — Telephone Encounter (Signed)
Your recommendations are appropriate.  I recommend a soft diet and continuing to hydrate well.  Ambulation with help to get the gas moving.   Gas X may also give her some comfort.

## 2013-07-04 NOTE — Telephone Encounter (Signed)
Office visit now.

## 2013-07-04 NOTE — Progress Notes (Signed)
Patient ID: Linda Castro, female   DOB: Jul 10, 1970, 43 y.o.   MRN: [id number] GYNECOLOGY  VISIT   HPI: 43 y.o.   Married  [place] American  female   [phone] with Patient's last menstrual period was 06/06/2013.   here for  3 day post op complaining of diarrhea with 3 loose stools today and some nausea.   Status post total abdominal hysterectomy with bilateral salpingo-oophorectomy for uterine fibroids on 07/01/13. Surgery and post op course uncomplicated.   Discharged to home on 07/03/13.   Yesterday am had diarrhea at the hospital.   At home had watery diarrhea when ate. Hydrating with clear liquids and has watery diarrhea when bowels move. No foul odor. No blood in stool.  No fevers. Nausea after clear liquids today. No vomiting.  Some cramping and gas like pain.   Taking Dilaudid orally every 5 hours and the ibuprofen every 6 hours. On Climara patch.  Did an enema the night before surgery.  No bowel prep.   GYNECOLOGIC HISTORY: Patient's last menstrual period was 06/06/2013. Contraception:  hysterectomy Menopausal hormone therapy: Climara patch        OB History   Grav Para Term Preterm Abortions TAB SAB Ect Mult Living   3 3 3       3          Patient Active Problem List   Diagnosis Date Noted  . Status post total abdominal hysterectomy and bilateral salpingo-oophorectomy 07/01/2013  . BREAST CYST, RIGHT 03/10/2010  . 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 SYNCOPE 01/16/2010  . ANEMIA, IRON DEFICIENCY 09/08/2009  . HYPERTENSION 09/08/2009  . SEIZURE DISORDER 09/08/2009  . FATIGUE 09/08/2009    Past Medical History  Diagnosis Date  . ANEMIA, IRON DEFICIENCY 09/08/2009  . BREAST CYST, RIGHT 03/10/2010  . FATIGUE 09/08/2009  . 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 SYNCOPE 01/16/2010  . Dyspareunia   . Dysmenorrhea   . Fibroid   . Urinary incontinence   . Fibroadenoma of right breast   . HYPERTENSION 09/08/2009    history, no longer on medications  . SEIZURE DISORDER 09/08/2009     childhood history, family history of seizures    Past Surgical History  Procedure Laterality Date  . Appendectomy  2006  . Breast surgery  2011    right--fibroadenoma removed  . Cesarean section  1989, 1993    x2  . Tubal ligation  1996  . Abdominal hysterectomy Bilateral 07/01/2013    Procedure: TOTAL ABDOMINAL HYSTERECTOMY WITH BILATERAL SALPINGO OOPHORECTOMY;  Surgeon: Jamey Reas de Berton Lan, MD;  Location: [facility];  Service: Gynecology;  Laterality: Bilateral;    Current Outpatient Prescriptions  Medication Sig Dispense Refill  . estradiol (CLIMARA) 0.05 mg/24hr patch Place 1 patch (0.05 mg total) onto the skin once a week.  4 patch  12  . Ferrous Sulfate (IRON) 325 (65 FE) MG TABS Take 1 tablet by mouth daily.      Marland Kitchen HYDROmorphone (DILAUDID) 2 MG tablet Take 1 tablet (2 mg total) by mouth every 3 (three) hours as needed for moderate pain or severe pain.  30 tablet  0  . ibuprofen (ADVIL,MOTRIN) 600 MG tablet Take 1 tablet (600 mg total) by mouth every 6 (six) hours as needed (mild pain).  30 tablet  0  . Multiple Vitamins-Iron (MULTIVITAMINS WITH IRON) TABS tablet Take 1 tablet by mouth daily.       No current facility-administered medications for this visit.     ALLERGIES: Hydrocodone and Oxycodone  Family History  Problem Relation Age of Onset  . Colon cancer Other     grandparent  . Diabetes Other     parent, grandparent, other relative  . Hypertension Other     parent, other relative  . Stroke Other     parent, other relative  . Cancer Mother     peritoneal/?ovarian cells  . Ovarian cancer Mother     ?  Marland Kitchen Hypertension Mother   . Heart attack Father   . Hypertension Father   . Hyperlipidemia Father   . Seizures Father     epilepsy  . Diabetes Sister   . Colon cancer Maternal Grandmother     History   Social History  . Marital Status: Married    Spouse Name: N/A    Number of Children: N/A  . Years of Education: N/A   Occupational History   . Not on file.   Social History Main Topics  . Smoking status: Never Smoker   . Smokeless tobacco: Never Used     Comment: Married, lives at home with spouse & 3 dtrs. Employed as admin asst @ [employer]  . Alcohol Use: No  . Drug Use: No  . Sexual Activity: Yes    Partners: Male    Birth Control/ Protection: Surgical     Comment: Tubal   Other Topics Concern  . Not on file   Social History Narrative  . No narrative on file    ROS:  Pertinent items are noted in HPI.  PHYSICAL EXAMINATION:    BP 136/80  Pulse 56  Temp(Src) 98.1 F (36.7 C) (Oral)  Ht 5' 6.5" (1.689 m)  Wt 161 lb (73.029 kg)  BMI 25.60 kg/m2  LMP 06/06/2013     General appearance: alert, cooperative and appears stated age.  Looks well.  Lungs: clear to auscultation bilaterally Heart: regular rate and rhythm Abdomen: soft, non-tender; no masses,  no organomegaly.  Incision clean, dry, intact.  No abnormal inguinal nodes palpated  ASSESSMENT  Diarrhea post op. Status post Cefotetan 2 gm IV pre- op.  PLAN  Will collect stool sample for C diff toxin.  Hydrate well. BRAT diet.  No Imodium until C diff back.  Rx for Zofran prn.  Return for worsening diarrhea, nausea and vomiting, fever, or abdominal distention.    An After Visit Summary was printed and given to the patient.

## 2013-07-05 ENCOUNTER — Other Ambulatory Visit: Payer: Self-pay | Admitting: Obstetrics and Gynecology

## 2013-07-05 DIAGNOSIS — R197 Diarrhea, unspecified: Secondary | ICD-10-CM

## 2013-07-05 LAB — CLOSTRIDIUM DIFFICILE EIA: CDIFTX: NEGATIVE

## 2013-07-07 ENCOUNTER — Ambulatory Visit (INDEPENDENT_AMBULATORY_CARE_PROVIDER_SITE_OTHER): Payer: 59 | Admitting: Obstetrics and Gynecology

## 2013-07-07 ENCOUNTER — Encounter: Payer: Self-pay | Admitting: Obstetrics and Gynecology

## 2013-07-07 VITALS — BP 120/80 | HR 64 | Resp 16 | Ht 66.5 in | Wt 161.0 lb

## 2013-07-07 DIAGNOSIS — R300 Dysuria: Secondary | ICD-10-CM

## 2013-07-07 DIAGNOSIS — Z9889 Other specified postprocedural states: Secondary | ICD-10-CM

## 2013-07-07 LAB — POCT URINALYSIS DIPSTICK
Bilirubin, UA: NEGATIVE
Glucose, UA: NEGATIVE
KETONES UA: NEGATIVE
Leukocytes, UA: NEGATIVE
Nitrite, UA: NEGATIVE
PROTEIN UA: NEGATIVE
RBC UA: NEGATIVE
UROBILINOGEN UA: NEGATIVE
pH, UA: 5

## 2013-07-07 NOTE — Progress Notes (Signed)
Patient ID: Linda Castro, female   DOB: 1970/05/29, 43 y.o.   MRN: [id number] GYNECOLOGY VISIT  PCP:   Referring provider:   HPI: 43 y.o.   Married  [place] American  female   [phone] with Patient's last menstrual period was 06/06/2013.   here for a recheck.  Presented 3 days ago with diarrhea.  Brought stool sample to the [facility] on 07/05/13. C diff toxin negative.  Stool culture pending.  Diarrhea resolving.  Some gas pains.  Taking the [employer].  No fever. Some nausea   Took one dose of the Zofran.  No vomiting.   Some low back pain.  Little bit or urinary pain today.   Getting better every day.   Using estrogen patch.  Still having hot flashes.  Will consider increasing estrogen patch dose.   Urine dip - negative.   GYNECOLOGIC HISTORY: Patient's last menstrual period was 06/06/2013. Sexually active:  Not currently Partner preference: female Contraception:  Tubal, hysterectomy  Menopausal hormone therapy: Climara patch DES exposure:  no Blood transfusions:   no Sexually transmitted diseases: no   GYN procedures and prior surgeries:  C-section x2, One VBAC, tubal ligation Last mammogram: 04/2013 Screening mammogram normal.  Pt. Had exam which revealed  Mass in right breast.  Pt. Had ultrasound 07-01-13 which revealed an oval mass at 11:00 in Right breast which was felt to be a fibroadenoma.  Suggested screening mammogram in one year. --[facility]           Last pap and high risk HPV testing:  04-15-13 wnl:neg HR HPV   OB History   Grav Para Term Preterm Abortions TAB SAB Ect Mult Living   3 3 3       3        LIFESTYLE: Exercise:  no             Tobacco: no Alcohol:  no Drug use:  no  Patient Active Problem List   Diagnosis Date Noted  . Status post total abdominal hysterectomy and bilateral salpingo-oophorectomy 07/01/2013  . BREAST CYST, RIGHT 03/10/2010  . 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 SYNCOPE 01/16/2010  . ANEMIA, IRON DEFICIENCY  09/08/2009  . HYPERTENSION 09/08/2009  . SEIZURE DISORDER 09/08/2009  . FATIGUE 09/08/2009    Past Medical History  Diagnosis Date  . ANEMIA, IRON DEFICIENCY 09/08/2009  . BREAST CYST, RIGHT 03/10/2010  . FATIGUE 09/08/2009  . 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 SYNCOPE 01/16/2010  . Dyspareunia   . Dysmenorrhea   . Fibroid   . Urinary incontinence   . Fibroadenoma of right breast   . HYPERTENSION 09/08/2009    history, no longer on medications  . SEIZURE DISORDER 09/08/2009    childhood history, family history of seizures    Past Surgical History  Procedure Laterality Date  . Appendectomy  2006  . Breast surgery  2011    right--fibroadenoma removed  . Cesarean section  1989, 1993    x2  . Tubal ligation  1996  . Abdominal hysterectomy Bilateral 07/01/2013    Procedure: TOTAL ABDOMINAL HYSTERECTOMY WITH BILATERAL SALPINGO OOPHORECTOMY;  Surgeon: Jamey Reas de Berton Lan, MD;  Location: [facility];  Service: Gynecology;  Laterality: Bilateral;    Current Outpatient Prescriptions  Medication Sig Dispense Refill  . estradiol (CLIMARA) 0.05 mg/24hr patch Place 1 patch (0.05 mg total) onto the skin once a week.  4 patch  12  . Ferrous Sulfate (IRON) 325 (65 FE) MG TABS Take 1 tablet by  mouth daily.      Marland Kitchen HYDROmorphone (DILAUDID) 2 MG tablet Take 1 tablet (2 mg total) by mouth every 3 (three) hours as needed for moderate pain or severe pain.  30 tablet  0  . ibuprofen (ADVIL,MOTRIN) 600 MG tablet Take 1 tablet (600 mg total) by mouth every 6 (six) hours as needed (mild pain).  30 tablet  0  . Multiple Vitamins-Iron (MULTIVITAMINS WITH IRON) TABS tablet Take 1 tablet by mouth daily.      . ondansetron (ZOFRAN) 4 MG tablet Take 1 tablet (4 mg total) by mouth every 8 (eight) hours as needed for nausea or vomiting.  20 tablet  0   No current facility-administered medications for this visit.     ALLERGIES: Hydrocodone and Oxycodone  Family History  Problem Relation Age of Onset  .  Colon cancer Other     grandparent  . Diabetes Other     parent, grandparent, other relative  . Hypertension Other     parent, other relative  . Stroke Other     parent, other relative  . Cancer Mother     peritoneal/?ovarian cells  . Ovarian cancer Mother     ?  Marland Kitchen Hypertension Mother   . Heart attack Father   . Hypertension Father   . Hyperlipidemia Father   . Seizures Father     epilepsy  . Diabetes Sister   . Colon cancer Maternal Grandmother     History   Social History  . Marital Status: Married    Spouse Name: N/A    Number of Children: N/A  . Years of Education: N/A   Occupational History  . Not on file.   Social History Main Topics  . Smoking status: Never Smoker   . Smokeless tobacco: Never Used     Comment: Married, lives at home with spouse & 3 dtrs. Employed as admin asst @ [employer]  . Alcohol Use: No  . Drug Use: No  . Sexual Activity: Yes    Partners: Male    Birth Control/ Protection: Surgical     Comment: Tubal   Other Topics Concern  . Not on file   Social History Narrative  . No narrative on file    ROS:  Pertinent items are noted in HPI.  PHYSICAL EXAMINATION:    BP 120/80  Pulse 64  Resp 16  Ht 5' 6.5" (1.689 m)  Wt 161 lb (73.029 kg)  BMI 25.60 kg/m2  LMP 06/06/2013   Wt Readings from Last 3 Encounters:  07/07/13 161 lb (73.029 kg)  07/04/13 161 lb (73.029 kg)  07/01/13 166 lb (75.297 kg)     Ht Readings from Last 3 Encounters:  07/07/13 5' 6.5" (1.689 m)  07/04/13 5' 6.5" (1.689 m)  07/01/13 5\' 7"  (1.702 m)    General appearance: alert, cooperative and appears stated age Abdomen: Incision - Pfannenstiel incision - clean, dry, intact.  soft, non-tender; no masses,  no organomegaly   ASSESSMENT  Status post TAH/BSO. Diarrhea resolving.  C Diff negative. Some dysuria.  PLAN  Follow up final stool culture. OK to expand dietary choices slowly.  Continue hydration.  Will check urine - dip and  culture.  Will monitor response to Climara 0.05 mg patch. Has an appointment for post op check on 08/11/13.  An After Visit Summary was printed and given to the patient.

## 2013-07-08 LAB — CULTURE, URINE COMPREHENSIVE
COLONY COUNT: NO GROWTH
Organism ID, Bacteria: NO GROWTH

## 2013-07-08 NOTE — Discharge Summary (Signed)
Physician Discharge Summary  Patient ID: Linda Castro MRN: [id number] DOB/AGE: 45-Aug-1972 45 y.o.  Admit date: 07/01/2013 Discharge date: 07/03/13  Admission Diagnoses:  Symptomatic uterine fibroids  Discharge Diagnoses:  Symptomatic uterine fibroids Active Problems:   Status post total abdominal hysterectomy and bilateral salpingo-oophorectomy   Discharged Condition: good  Hospital Course:  The patient was admitted on 07/01/13 at which time she underwent a total abdominal hysterectomy with bilateral salpingo-oophorectomy for symptomatic uterine fibroids, which was performed without complication.  The patient requested removal of her tubes and ovaries due to a family history of her mother with a primary peritoneal type tumor.  The patient's post op course was benign.  She had control of her pain with a dilaudid PCA and toradol, which were converted to oral dilaudid on post op day one, when she began taking po.  Her diet was slowly advanced to normal.  She voided on post op day one when her foley catheter was removed.  She ambulated independently during her hospitalization and received Ted hose and PAS stockings for DVT prophylaxis.  Her vital signs and urine output were good.  Her incision remained clean, dry, and intact.  She had very minimal vaginal bleeding. The patient was started on an estradiol patch 0.05 mg to be applied weekly.  Her post op day one hemoglobin was 9.2, and started at 9.5 preop. Her final pathology report documented benign uterine fibroids and polyp formation of the endometrium.  Her cervix, tubes, and ovaries were benign.  She was found to be in good condition and ready for discharge on post op day number two.  Consults: None  Significant Diagnostic Studies:  Hgb 9.2.  Pathology report as above.   Treatments: surgery:  Total abdominal hysterectomy with bilateral salpingo-oophorectomy.   Discharge Exam: Blood pressure 118/70, pulse 80, temperature 97.8 F (36.6  C), temperature source Oral, resp. rate 16, height 5\' 7"  (1.702 m), weight 166 lb (75.297 kg), SpO2 100.00%. General appearance: alert and no distress GI: soft, non-tender; bowel sounds normal; no masses,  no organomegaly and  Incision:  clean, dry, intact.  Sutures in place.   Disposition: 01-Home or Self Care   Future Appointments Provider Department Dept Phone   08/11/2013 1:00 PM [place] de Berton Lan, MD [place]       Medication List         estradiol 0.05 mg/24hr patch  Commonly known as:  CLIMARA  Place 1 patch (0.05 mg total) onto the skin once a week.     HYDROmorphone 2 MG tablet  Commonly known as:  DILAUDID  Take 1 tablet (2 mg total) by mouth every 3 (three) hours as needed for moderate pain or severe pain.     ibuprofen 600 MG tablet  Commonly known as:  ADVIL,MOTRIN  Take 1 tablet (600 mg total) by mouth every 6 (six) hours as needed (mild pain).     Iron 325 (65 FE) MG Tabs  Take 1 tablet by mouth daily.     multivitamins with iron Tabs tablet  Take 1 tablet by mouth daily.           Follow-up Information   Follow up with Amundson de Berton Lan, MD In 1 week.   Specialty:  Obstetrics and Gynecology   Contact information:   [address] [place] [address] [phone]       Signed: Tacy Learn 07/08/2013, 6:48 PM

## 2013-07-09 ENCOUNTER — Telehealth: Payer: Self-pay | Admitting: Obstetrics and Gynecology

## 2013-07-09 ENCOUNTER — Other Ambulatory Visit: Payer: Self-pay | Admitting: Obstetrics and Gynecology

## 2013-07-09 LAB — STOOL CULTURE

## 2013-07-09 MED ORDER — FLAVOXATE HCL 100 MG PO TABS: 100.0000 mg | ORAL_TABLET | Freq: Three times a day (TID) | ORAL | Status: DC | PRN

## 2013-07-09 NOTE — Telephone Encounter (Signed)
Pt was just in on Monday for follow up from surgery and was told to call if her pain got any worse.

## 2013-07-09 NOTE — Progress Notes (Signed)
Patient calling nurse with pressure and discomfort at the end of voiding.  No fevers or unusual drainage per conversation between patient and nurse.  Urine culture and stool cultures negative.  Possible bladder spasms.  Will prescribe Urised, flavoxate 100 mg po tid prn bladder spasms.

## 2013-07-09 NOTE — Telephone Encounter (Signed)
   Patient calling nurse with pressure and discomfort at the end of voiding.  No fevers or unusual drainage per conversation between patient and nurse.  Urine culture and stool cultures negative.  Possible bladder spasms.  Will prescribe Urised, flavoxate 100 mg po tid prn bladder spasms.   Calling patient with message from Dr. Quincy Simmonds.   Patient last normal bm today. No issues with constipation per patient. Denies any vaginal bleeding or vaginal discharge. Will try Urised as directed by Dr. Quincy Simmonds and follow up if not improved. Patient agreeable to plan.  Routing to provider for final review. Patient agreeable to disposition. Will close encounter

## 2013-07-09 NOTE — Telephone Encounter (Signed)
Spoke with patient. She states pain with urination started on Monday and has discussed with Dr. Quincy Simmonds at her appointment. Urine culture results in, advised negative for any bacteria. Patient states she feels it "closer to when my bladder empties, my stomach starts cramping, and I feel pressure in my bladder." Patient states these symptoms have increased each day since Monday. No fevers or abdominal pain.

## 2013-07-11 ENCOUNTER — Telehealth: Payer: Self-pay | Admitting: Obstetrics and Gynecology

## 2013-07-11 MED ORDER — HYDROMORPHONE HCL 2 MG PO TABS: 2.0000 mg | ORAL_TABLET | ORAL | Status: DC | PRN

## 2013-07-11 MED ORDER — IBUPROFEN 600 MG PO TABS: 600.0000 mg | ORAL_TABLET | Freq: Four times a day (QID) | ORAL | Status: DC | PRN

## 2013-07-11 NOTE — Telephone Encounter (Signed)
Pt is calling to get refills for HYDRO morphone (DILAUDID) 2 MG tablet and ibuprofen 600 mg. Pt is using [facility]

## 2013-07-11 NOTE — Telephone Encounter (Signed)
Phone call returned to patient.  Patient states she is overdoing it at home.  Feeling better now that she is resting, she is feeling better.  Asking about refill on dilaudid and ibuprofen.  Ibuprofen is working well.  Incision looks good, just smells a little moist.   Bladder spasms are resolved with urispas.  I will refill the ibuprofen for the patient.   I would refill the dilaudid, but the patient is unable to come to the office to pick up the narcotic prescription.   She does not tolerate hydrocodone and oxycodone due to GI upset.  She will call me during the weekend if she has problems, as I am on call.

## 2013-07-12 ENCOUNTER — Encounter: Payer: Self-pay | Admitting: Obstetrics and Gynecology

## 2013-07-12 ENCOUNTER — Telehealth: Payer: Self-pay | Admitting: Obstetrics and Gynecology

## 2013-07-12 MED ORDER — TRAMADOL HCL 50 MG PO TABS: 50.0000 mg | ORAL_TABLET | Freq: Four times a day (QID) | ORAL | Status: DC | PRN

## 2013-07-12 NOTE — Telephone Encounter (Signed)
Phone call from patient through answering service. Phone call returned.  Patient taking ibuprofen 600 mg po q 6 hours and still having pain and aching.  Ran out of Dilaudid.  Asking for Rx.  I am sending Tx for Tramadol to her pharmacy. Patient instructed in use.

## 2013-08-04 ENCOUNTER — Ambulatory Visit: Payer: 59 | Admitting: Obstetrics and Gynecology

## 2013-08-08 ENCOUNTER — Ambulatory Visit (INDEPENDENT_AMBULATORY_CARE_PROVIDER_SITE_OTHER): Payer: 59 | Admitting: Obstetrics and Gynecology

## 2013-08-08 ENCOUNTER — Encounter: Payer: Self-pay | Admitting: Obstetrics and Gynecology

## 2013-08-08 ENCOUNTER — Telehealth: Payer: Self-pay | Admitting: Obstetrics and Gynecology

## 2013-08-08 VITALS — BP 134/88 | HR 80 | Resp 16 | Ht 66.5 in | Wt 160.0 lb

## 2013-08-08 DIAGNOSIS — Z9889 Other specified postprocedural states: Secondary | ICD-10-CM

## 2013-08-08 DIAGNOSIS — N9489 Other specified conditions associated with female genital organs and menstrual cycle: Secondary | ICD-10-CM

## 2013-08-08 DIAGNOSIS — D649 Anemia, unspecified: Secondary | ICD-10-CM

## 2013-08-08 DIAGNOSIS — R102 Pelvic and perineal pain unspecified side: Secondary | ICD-10-CM

## 2013-08-08 LAB — POCT URINALYSIS DIPSTICK
Bilirubin, UA: NEGATIVE
Glucose, UA: NEGATIVE
Ketones, UA: NEGATIVE
Leukocytes, UA: NEGATIVE
NITRITE UA: NEGATIVE
Protein, UA: NEGATIVE
RBC UA: NEGATIVE
UROBILINOGEN UA: NEGATIVE
pH, UA: 5

## 2013-08-08 LAB — HEMOGLOBIN, FINGERSTICK: Hemoglobin, fingerstick: 12 g/dL (ref 12.0–16.0)

## 2013-08-08 MED ORDER — ESTRADIOL 0.1 MG/24HR TD PTWK: 0.1000 mg | MEDICATED_PATCH | TRANSDERMAL | Status: DC

## 2013-08-08 NOTE — Telephone Encounter (Signed)
Spoke with patient. Apologized for no appointment available on 5/4 in afternoon with Dr.Silva. Offered May 4th appointment patient declines. Patient requesting May 8th. Appointment scheduled for May 8th at 10:00am with Dr.Silva. Patient is agreeable to date and time.  Routing to provider for final review. Patient agreeable to disposition. Will close encounter.

## 2013-08-08 NOTE — Patient Instructions (Signed)
It is OK to return to work next week.

## 2013-08-08 NOTE — Telephone Encounter (Signed)
Pt needs to schedule for a 2 week reck with Dr Quincy Simmonds, pt prefer Monday 5/4 in the afternoon if possible.

## 2013-08-08 NOTE — Progress Notes (Signed)
Patient ID: Linda Castro, female   DOB: 05-19-1970, 43 y.o.   MRN: [id number]  Subjective  Patient is here for a 6 week post op check. Status post TAH/BSO 07/01/13 for symptomatic fibroids. Some abdominal stinging.  Having fatigue.   On Climara 0.05 mg weekly. Having terrible hot flashes at night.  Also occur during the day.  Vaginal dryness.  Having vaginal aching.  Muscle soreness. No burning. Feels discomfort with urination.  Feels like she has held urination too long. Had bladder spasms and is still occasionally taking med.  Stools normal.  Hgb 9.2 on 07/02/13. Taking iron twice a day and multivitamin daily.   Aching in legs bilaterally.  No edema. Restless feeling. Some joint pains.  Objective  Abdomen - incision well healed. Pelvic - normal external genitalia and urethra.  Cervix absent.  Vagina well healed.  No cuff erythema, drainage, or bleeding.  Bimanual exam - mild tenderness with no masses.  Assessment  Menopausal symptoms. Pelvic pressure Anemia  Plan  OK to return to work next week.  No lifting over 10 - 15 pounds and no sexual activity yet. Increase Climara to 0.1 mg weekly.  See new Rx.  Check Hgb and urine dip.  Hgb 12.0.  Urine negative. OK to stop iron.  Return in 2 weeks.   After visit summary to patient.

## 2013-08-11 ENCOUNTER — Encounter: Payer: Self-pay | Admitting: Obstetrics and Gynecology

## 2013-08-11 ENCOUNTER — Ambulatory Visit: Payer: 59 | Admitting: Obstetrics and Gynecology

## 2013-08-11 ENCOUNTER — Telehealth: Payer: Self-pay | Admitting: Obstetrics and Gynecology

## 2013-08-11 NOTE — Telephone Encounter (Signed)
I am ok with a letter with all of the clarifications as you noted.   - return to work starting 4/20 with a 6 hour day - increase to full time on 4/27 - lifting restrictions until seen for the final visit as scheduled in May, at which time I anticipate return to full activities.  I will try to sent this through My Chart.  If I am unable, I will ask for your assistance.

## 2013-08-11 NOTE — Telephone Encounter (Signed)
Spoke with Chrystal at [facility], clarified order, to be Climara 0.1 mg Patch, change once per week. They do not have any in stock, will order for tomorrow for patient.   Spoke with patient. She will be returning to work tomorrow, 08/12/13. She requests that she receive a note to go back to work for 6 hours per day starting tomorrow and then to return to work full time on 08/18/13. She states this will not change her disability claim. I advised I would have to have Dr. Quincy Simmonds review prior to sending a letter with this request.  Patient will be at work tomorrow and has discussed shorter hours and they are agreeable. Patient requests letter be forwarded to her mychart account as that will be the easiest way to send a letter. I advised I would send her request to Dr. Quincy Simmonds and would return her call.   Dr. Quincy Simmonds, okay to write letter with 6 hour days for one week? Then return to work full time 08/18/13, lifting restrictions until cleared by MD.

## 2013-08-11 NOTE — Telephone Encounter (Signed)
Pt would like to speak with the nurse concerning returning to work.

## 2013-08-11 NOTE — Telephone Encounter (Signed)
Pharmacy calling needing clarification on RX sent electronically for Vivelle patches. The dosing is usually 2 a week and it was written for one patch a week.

## 2013-08-12 NOTE — Telephone Encounter (Signed)
Sent mychart message to patient with note from Dr. Quincy Simmonds. Advised patient to call our office for faxed letter if message will not suffice.

## 2013-08-13 ENCOUNTER — Ambulatory Visit: Payer: 59 | Admitting: Obstetrics and Gynecology

## 2013-08-29 ENCOUNTER — Encounter: Payer: Self-pay | Admitting: Obstetrics and Gynecology

## 2013-08-29 ENCOUNTER — Ambulatory Visit (INDEPENDENT_AMBULATORY_CARE_PROVIDER_SITE_OTHER): Payer: 59 | Admitting: Obstetrics and Gynecology

## 2013-08-29 ENCOUNTER — Telehealth: Payer: Self-pay | Admitting: Obstetrics and Gynecology

## 2013-08-29 VITALS — BP 130/80 | HR 70 | Ht 66.5 in | Wt 163.0 lb

## 2013-08-29 DIAGNOSIS — B373 Candidiasis of vulva and vagina: Secondary | ICD-10-CM

## 2013-08-29 DIAGNOSIS — N951 Menopausal and female climacteric states: Secondary | ICD-10-CM

## 2013-08-29 DIAGNOSIS — R6882 Decreased libido: Secondary | ICD-10-CM

## 2013-08-29 DIAGNOSIS — B3731 Acute candidiasis of vulva and vagina: Secondary | ICD-10-CM

## 2013-08-29 MED ORDER — TRIAMCINOLONE ACETONIDE 0.1 % EX CREA: 1.0000 "application " | TOPICAL_CREAM | Freq: Two times a day (BID) | CUTANEOUS | Status: DC

## 2013-08-29 MED ORDER — NYSTATIN 100000 UNIT/GM EX CREA: 1.0000 "application " | TOPICAL_CREAM | Freq: Two times a day (BID) | CUTANEOUS | Status: DC

## 2013-08-29 MED ORDER — FLUCONAZOLE 150 MG PO TABS: 150.0000 mg | ORAL_TABLET | Freq: Once | ORAL | Status: DC

## 2013-08-29 MED ORDER — EST ESTROGENS-METHYLTEST 1.25-2.5 MG PO TABS: 1.0000 | ORAL_TABLET | Freq: Every day | ORAL | Status: DC

## 2013-08-29 MED ORDER — NYSTATIN-TRIAMCINOLONE 100000-0.1 UNIT/GM-% EX CREA: 1.0000 "application " | TOPICAL_CREAM | Freq: Two times a day (BID) | CUTANEOUS | Status: DC

## 2013-08-29 NOTE — Telephone Encounter (Signed)
Pharmacy called re: RX not covered by insurance the way it is written. They will cover it if it is written in two different RX's: one for nysatin and one for Triamcinolone.

## 2013-08-29 NOTE — Telephone Encounter (Signed)
Rx changed to:   Triamcinolone Cream (Kenalog) 0.1% Dispense 30 gram Apply topically bid. Nystatin Cream (Mycostatin) Dispense 30 gram Apply topically bid.  Mix triamcinolone with nystatin cream and apply bid.   Spoke with patient and she is instructed about change and is agreeable.

## 2013-08-29 NOTE — Progress Notes (Signed)
Patient ID: Linda Castro, female   DOB: 03-27-1971, 43 y.o.   MRN: [id number] GYNECOLOGY  VISIT   HPI: 43 y.o.   Married  [place] American  female   [phone] with Patient's last menstrual period was 06/06/2013.   here for 8 week post op visit TAH/BSO.   Patient is complaining of some vaginal irritation/itching. Feels like a yeast infection. No odor.  Used some Vagasil and decreased symptoms but still a lot of itching.   Vaginal pressure and back pressure is better.  Increased Climara to 0.1 mg weekly.  When she is due to change the patch, has some symptoms of dryness and leg aching.  When puts on a new patch, it resolved and patient feels really good.  Decreased libido.    GYNECOLOGIC HISTORY: Patient's last menstrual period was 06/06/2013. Contraception:   Tubal ligation/hysterectomy Menopausal hormone therapy: Climara patch        OB History   Grav Para Term Preterm Abortions TAB SAB Ect Mult Living   3 3 3       3          Patient Active Problem List   Diagnosis Date Noted  . Status post total abdominal hysterectomy and bilateral salpingo-oophorectomy 07/01/2013  . BREAST CYST, RIGHT 03/10/2010  . 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 SYNCOPE 01/16/2010  . ANEMIA, IRON DEFICIENCY 09/08/2009  . HYPERTENSION 09/08/2009  . SEIZURE DISORDER 09/08/2009  . FATIGUE 09/08/2009    Past Medical History  Diagnosis Date  . ANEMIA, IRON DEFICIENCY 09/08/2009  . BREAST CYST, RIGHT 03/10/2010  . FATIGUE 09/08/2009  . 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 SYNCOPE 01/16/2010  . Dyspareunia   . Dysmenorrhea   . Fibroid   . Urinary incontinence   . Fibroadenoma of right breast   . HYPERTENSION 09/08/2009    history, no longer on medications  . SEIZURE DISORDER 09/08/2009    childhood history, family history of seizures    Past Surgical History  Procedure Laterality Date  . Appendectomy  2006  . Breast surgery  2011    right--fibroadenoma removed  . Cesarean section  1989, 1993    x2  . Tubal  ligation  1996  . Abdominal hysterectomy Bilateral 07/01/2013    Procedure: TOTAL ABDOMINAL HYSTERECTOMY WITH BILATERAL SALPINGO OOPHORECTOMY;  Surgeon: Jamey Reas de Berton Lan, MD;  Location: [facility];  Service: Gynecology;  Laterality: Bilateral;    Current Outpatient Prescriptions  Medication Sig Dispense Refill  . estradiol (CLIMARA) 0.1 mg/24hr patch Place 1 patch (0.1 mg total) onto the skin once a week.  4 patch  10  . ibuprofen (ADVIL,MOTRIN) 600 MG tablet Take 1 tablet (600 mg total) by mouth every 6 (six) hours as needed (mild pain).  30 tablet  0  . Multiple Vitamins-Iron (MULTIVITAMINS WITH IRON) TABS tablet Take 1 tablet by mouth daily.       No current facility-administered medications for this visit.     ALLERGIES: Hydrocodone and Oxycodone  Family History  Problem Relation Age of Onset  . Colon cancer Other     grandparent  . Diabetes Other     parent, grandparent, other relative  . Hypertension Other     parent, other relative  . Stroke Other     parent, other relative  . Cancer Mother     peritoneal/?ovarian cells  . Ovarian cancer Mother     ?  Marland Kitchen Hypertension Mother   . Heart attack Father   . Hypertension Father   .  Hyperlipidemia Father   . Seizures Father     epilepsy  . Diabetes Sister   . Colon cancer Maternal Grandmother     History   Social History  . Marital Status: Married    Spouse Name: N/A    Number of Children: N/A  . Years of Education: N/A   Occupational History  . Not on file.   Social History Main Topics  . Smoking status: Never Smoker   . Smokeless tobacco: Never Used     Comment: Married, lives at home with spouse & 3 dtrs. Employed as admin asst @ [employer]  . Alcohol Use: No  . Drug Use: No  . Sexual Activity: Yes    Partners: Male    Birth Control/ Protection: Surgical     Comment: Tubal   Other Topics Concern  . Not on file   Social History Narrative  . No narrative on file    ROS:   Pertinent items are noted in HPI.  PHYSICAL EXAMINATION:    BP 130/80  Pulse 70  Ht 5' 6.5" (1.689 m)  Wt 163 lb (73.936 kg)  BMI 25.92 kg/m2  LMP 06/06/2013     General appearance: alert, cooperative and appears stated age  Abdomen: Pfannenstiel incision intact, soft, non-tender; no masses,  no organomegaly No abnormal inguinal nodes palpated  Pelvic: External genitalia:  no lesions              Urethra:  normal appearing urethra with no masses, tenderness or lesions              Bartholins and Skenes: normal                 Vagina: normal appearing vagina, cuff intact, no lesions.  Thick white discharge.              Cervix: absent                   Bimanual Exam:  Uterus:   absent                                      Adnexa: no masses                                 Wet prep - pH 4.0, positive yeast, negative clue cells and trichomonas.  ASSESSMENT  Yeast vaginitis.  Menopausal symptoms.  Decreased libido.  PLAN  Stop Climara patch.  Start Estratest.  See Epic orders. Diflucan and Mycolog II.  See Epic orders. Follow up in 2 months for recheck of HRT regimen.  Information to patient on menopause and yeast vulvovaginitis.    An After Visit Summary was printed and given to the patient.  _15_____ minutes face to face time of which over 50% was spent in counseling regarding yeast vaginitis and menopausal symptoms and treatment.

## 2013-08-29 NOTE — Patient Instructions (Signed)
Monilial Vaginitis Vaginitis in a soreness, swelling and redness (inflammation) of the vagina and vulva. Monilial vaginitis is not a sexually transmitted infection. CAUSES  Yeast vaginitis is caused by yeast (candida) that is normally found in your vagina. With a yeast infection, the candida has overgrown in number to a point that upsets the chemical balance. SYMPTOMS   White, thick vaginal discharge.  Swelling, itching, redness and irritation of the vagina and possibly the lips of the vagina (vulva).  Burning or painful urination.  Painful intercourse. DIAGNOSIS  Things that may contribute to monilial vaginitis are:  Postmenopausal and virginal states.  Pregnancy.  Infections.  Being tired, sick or stressed, especially if you had monilial vaginitis in the past.  Diabetes. Good control will help lower the chance.  Birth control pills.  Tight fitting garments.  Using bubble bath, feminine sprays, douches or deodorant tampons.  Taking certain medications that kill germs (antibiotics).  Sporadic recurrence can occur if you become ill. TREATMENT  Your caregiver will give you medication.  There are several kinds of anti monilial vaginal creams and suppositories specific for monilial vaginitis. For recurrent yeast infections, use a suppository or cream in the vagina 2 times a week, or as directed.  Anti-monilial or steroid cream for the itching or irritation of the vulva may also be used. Get your caregiver's permission.  Painting the vagina with methylene blue solution may help if the monilial cream does not work.  Eating yogurt may help prevent monilial vaginitis. HOME CARE INSTRUCTIONS   Finish all medication as prescribed.  Do not have sex until treatment is completed or after your caregiver tells you it is okay.  Take warm sitz baths.  Do not douche.  Do not use tampons, especially scented ones.  Wear cotton underwear.  Avoid tight pants and panty  hose.  Tell your sexual partner that you have a yeast infection. They should go to their caregiver if they have symptoms such as mild rash or itching.  Your sexual partner should be treated as well if your infection is difficult to eliminate.  Practice safer sex. Use condoms.  Some vaginal medications cause latex condoms to fail. Vaginal medications that harm condoms are:  Cleocin cream.  Butoconazole (Femstat).  Terconazole (Terazol) vaginal suppository.  Miconazole (Monistat) (may be purchased over the counter). SEEK MEDICAL CARE IF:   You have a temperature by mouth above 102 F (38.9 C).  The infection is getting worse after 2 days of treatment.  The infection is not getting better after 3 days of treatment.  You develop blisters in or around your vagina.  You develop vaginal bleeding, and it is not your menstrual period.  You have pain when you urinate.  You develop intestinal problems.  You have pain with sexual intercourse. Document Released: 01/18/2005 Document Revised: 07/03/2011 Document Reviewed: 10/02/2008 [facility] Patient Information 2014 [place], [place].  Esterified Estrogens; Methyltestosterone tablets What is this medicine? ESTERIFIED ESTROGENS; METHYLTESTOSTERONE (es TAIR i fyed ES troe jenz; meth il tes TOS te rone) is a combination of hormones. This medicine is used to treat some of the symptoms of menopause like hot flashes and vaginal dryness. This medicine may be used for other purposes; ask your health care provider or pharmacist if you have questions. COMMON BRAND NAME(S): Covaryx H.S., Covaryx, EEMT HS, EEMT, Essian HS, Essian, Estratest HS, Estratest, Syntest DS , Syntest HS  What should I tell my health care provider before I take this medicine? They need to know if you have  any of these conditions: -abnormal vaginal bleeding -blood vessel disease or blood clots -breast, cervical, endometrial, ovarian, liver, or uterine  cancer -dementia -diabetes -gallbladder disease -heart disease or recent heart attack -high blood pressure -high cholesterol -high level of calcium in the blood -hysterectomy -kidney disease -liver disease -migraine headaches -stroke -systemic lupus erythematosus (SLE) -tobacco smoker -vaginal bleeding -an unusual or allergic reaction to estrogens, other hormones, medicines, foods, dyes, or preservatives -pregnant or trying to get pregnant -breast-feeding How should I use this medicine? Take this medicine by mouth with a glass of water. To reduce nausea, this medicine may be taken with food. Follow the directions on the prescription label. Take this medicine at the same time each day. Do not take your medicine more often than directed. Talk to your pediatrician regarding the use of this medicine in children. Special care may be needed. A patient package insert for the product will be given with each prescription and refill. Read this sheet carefully each time. The sheet may change frequently. Overdosage: If you think you have taken too much of this medicine contact a poison control center or emergency room at once. NOTE: This medicine is only for you. Do not share this medicine with others. What if I miss a dose? If you miss a dose, take it as soon as you can. If it is almost time for your next dose, take only that dose. Do not take double or extra doses. What may interact with this medicine? Do not take this medicine with any of the following medications: -medicines for cancer like aminoglutethimide, anastrozole, exemestane, letrozole, testolactone, vorozole This medicine may also interact with the following medications: -antibiotics like erythromycin, clarithromycin -carbamazepine -female hormones, like estrogens or progestins and birth control pills -grapefruit juice -herbal remedies for menopause or female problems -insulin -itraconazole -ketoconazole -medicines that treat  or prevent blood clots like warfarin -oxyphenbutazone -phenobarbital -rifampin -ritonavir -St. John's Wort This list may not describe all possible interactions. Give your health care provider a list of all the medicines, herbs, non-prescription drugs, or dietary supplements you use. Also tell them if you smoke, drink alcohol, or use illegal drugs. Some items may interact with your medicine. What should I watch for while using this medicine? Visit your doctor or health care professional for regular checks on your progress. You will need a regular breast and pelvic exam and Pap smear while on this medicine. You should also discuss the need for regular mammograms with your health care professional, and follow his or her guidelines for these tests. This medicine can make your body retain fluid, making your fingers, hands, or ankles swell. Your blood pressure can go up. Contact your doctor or health care professional if you feel you are retaining fluid. If you have any reason to think you are pregnant, stop taking this medicine right away and contact your doctor or health care professional. Smoking increases the risk of getting a blood clot or having a stroke while you are taking this medicine, especially if you are more than 45 years old. You are strongly advised not to smoke. If you wear contact lenses and notice visual changes, or if the lenses begin to feel uncomfortable, consult your eye doctor or health care professional. This medicine can increase the risk of developing a condition (endometrial hyperplasia) that may lead to cancer of the lining of the uterus. Taking progestins, another hormone drug, with this medicine lowers the risk of developing this condition. Therefore, if your uterus has not been  removed (by a hysterectomy), your doctor may prescribe a progestin for you to take together with your estrogen. You should know, however, that taking estrogens with progestins may have additional health  risks. You should discuss the use of estrogens and progestins with your health care professional to determine the benefits and risks for you. If you are going to have surgery, you may need to stop taking this medicine. Consult your health care professional for advice before you schedule the surgery. What side effects may I notice from receiving this medicine? Side effects that you should report to your doctor or health care professional as soon as possible: -allergic reactions like skin rash, itching or hives, swelling of the face, lips, or tongue -breast tissue changes or discharge -changes in vision -chest pain -confusion, trouble speaking or understanding -dark urine -general ill feeling or flu-like symptoms -light-colored stools -nausea, vomiting -pain, swelling, warmth in the leg -right upper belly pain -severe headaches -shortness of breath -sudden numbness or weakness of the face, arm or leg -trouble walking, dizziness, loss of balance or coordination -unusual vaginal bleeding -yellowing of the eyes or skin Side effects that usually do not require medical attention (report to your doctor or health care professional if they continue or are bothersome): -hair loss -increased hunger or thirst -increased urination -symptoms of vaginal infection like itching, irritation or unusual discharge -unusually weak or tired This list may not describe all possible side effects. Call your doctor for medical advice about side effects. You may report side effects to FDA at 1-800-FDA-1088. Where should I keep my medicine? Keep out of the reach of children. Store at room temperature between 15 and 30 degrees C (59 and 86 degrees F). Throw away any unused medicine after the expiration date. NOTE: This sheet is a summary. It may not cover all possible information. If you have questions about this medicine, talk to your doctor, pharmacist, or health care provider.  2014, Elsevier/Gold Standard.  (2008-03-26 11:46:40)

## 2013-10-27 ENCOUNTER — Ambulatory Visit: Payer: 59 | Admitting: Obstetrics and Gynecology

## 2013-10-29 ENCOUNTER — Ambulatory Visit (INDEPENDENT_AMBULATORY_CARE_PROVIDER_SITE_OTHER): Payer: 59 | Admitting: Obstetrics and Gynecology

## 2013-10-29 ENCOUNTER — Encounter: Payer: Self-pay | Admitting: Obstetrics and Gynecology

## 2013-10-29 VITALS — BP 140/86 | HR 68 | Ht 66.5 in | Wt 163.0 lb

## 2013-10-29 DIAGNOSIS — R3129 Other microscopic hematuria: Secondary | ICD-10-CM

## 2013-10-29 DIAGNOSIS — N951 Menopausal and female climacteric states: Secondary | ICD-10-CM

## 2013-10-29 DIAGNOSIS — R109 Unspecified abdominal pain: Secondary | ICD-10-CM

## 2013-10-29 DIAGNOSIS — R635 Abnormal weight gain: Secondary | ICD-10-CM

## 2013-10-29 LAB — POCT URINALYSIS DIPSTICK
Bilirubin, UA: NEGATIVE
Glucose, UA: NEGATIVE
KETONES UA: NEGATIVE
LEUKOCYTES UA: NEGATIVE
Nitrite, UA: NEGATIVE
PH UA: 8
Protein, UA: NEGATIVE
Urobilinogen, UA: NEGATIVE

## 2013-10-29 MED ORDER — EST ESTROGENS-METHYLTEST 0.625-1.25 MG PO TABS: 1.0000 | ORAL_TABLET | Freq: Every day | ORAL | Status: DC

## 2013-10-29 NOTE — Patient Instructions (Signed)
Call if you are having problems with your hormone therapy!  I will see you back in December for your routine check up.

## 2013-10-29 NOTE — Progress Notes (Signed)
GYNECOLOGY VISIT  PCP:   Referring provider:   HPI: 43 y.o.   Married  [place] American  female   [phone] with Patient's last menstrual period was 06/06/2013.   here for   Generalized abdominal pain, and Follow up. Some bilateral lower abdominal pain 3 days ago.  Decreased but comes and goes.  Exercising more now.  Notices it more if sitting a lot. Heating pad helping.  No dysuria.  Bowel movements all back to normal.   Status post total abdominal hysterectomy with bilateral salpingo-  Oophorectomy on 07/01/13.  Started Estratest 2 months ago.  Gaining weight, about 6 pounds.  Hgb:  07/2013=12.0 Urine:  Trace blood.  GYNECOLOGIC HISTORY: Patient's last menstrual period was 06/06/2013. Sexually active:  Yes Partner preference: Female  Contraception: Hysterectomy   Menopausal hormone therapy: Estratest DES exposure:  No  Blood transfusions:   No Sexually transmitted diseases:   No GYN procedures and prior surgeries:  Hysterectomy C-Sections, BTL Last mammogram: 04/2013 [id number]                Last pap and high risk HPV testing:   03/2013 Neg. HR HPV Neg History of abnormal pap smear:  No   OB History   Grav Para Term Preterm Abortions TAB SAB Ect Mult Living   3 3 3       3        LIFESTYLE: Exercise:   Yes, Running, walking, weights 5 x weekly            Tobacco: No Alcohol: No Drug use: No   Patient Active Problem List   Diagnosis Date Noted  . Status post total abdominal hysterectomy and bilateral salpingo-oophorectomy 07/01/2013  . BREAST CYST, RIGHT 03/10/2010  . 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 SYNCOPE 01/16/2010  . ANEMIA, IRON DEFICIENCY 09/08/2009  . HYPERTENSION 09/08/2009  . SEIZURE DISORDER 09/08/2009  . FATIGUE 09/08/2009    Past Medical History  Diagnosis Date  . ANEMIA, IRON DEFICIENCY 09/08/2009  . BREAST CYST, RIGHT 03/10/2010  . FATIGUE 09/08/2009  . 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 SYNCOPE 01/16/2010  . Dyspareunia   . Dysmenorrhea   . Fibroid   . Urinary  incontinence   . Fibroadenoma of right breast   . HYPERTENSION 09/08/2009    history, no longer on medications  . SEIZURE DISORDER 09/08/2009    childhood history, family history of seizures    Past Surgical History  Procedure Laterality Date  . Appendectomy  2006  . Breast surgery  2011    right--fibroadenoma removed  . Cesarean section  1989, 1993    x2  . Tubal ligation  1996  . Abdominal hysterectomy Bilateral 07/01/2013    Procedure: TOTAL ABDOMINAL HYSTERECTOMY WITH BILATERAL SALPINGO OOPHORECTOMY;  Surgeon: Jamey Reas de Berton Lan, MD;  Location: [facility];  Service: Gynecology;  Laterality: Bilateral;    Current Outpatient Prescriptions  Medication Sig Dispense Refill  . estrogen-methylTESTOSTERone (ESTRATEST) 1.25-2.5 MG per tablet Take 1 tablet by mouth daily.  90 tablet  2  . Multiple Vitamins-Iron (MULTIVITAMINS WITH IRON) TABS tablet Take 1 tablet by mouth daily.      Marland Kitchen ibuprofen (ADVIL,MOTRIN) 600 MG tablet Take 1 tablet (600 mg total) by mouth every 6 (six) hours as needed (mild pain).  30 tablet  0  . nystatin cream (MYCOSTATIN) Apply 1 application topically 2 (two) times daily. Combine with  Kenalog cream and apply bid.  30 g  0  . nystatin-triamcinolone (MYCOLOG II) cream Apply 1 application  topically 2 (two) times daily. Apply to affected area BID for up to 7 days.  60 g  0  . triamcinolone cream (KENALOG) 0.1 % Apply 1 application topically 2 (two) times daily. Combine with nystatin cream and apply bid.  30 g  0   No current facility-administered medications for this visit.     ALLERGIES: Hydrocodone and Oxycodone  Family History  Problem Relation Age of Onset  . Colon cancer Other     grandparent  . Diabetes Other     parent, grandparent, other relative  . Hypertension Other     parent, other relative  . Stroke Other     parent, other relative  . Cancer Mother     peritoneal/?ovarian cells  . Ovarian cancer Mother     ?  Marland Kitchen Hypertension  Mother   . Heart attack Father   . Hypertension Father   . Hyperlipidemia Father   . Seizures Father     epilepsy  . Diabetes Sister   . Colon cancer Maternal Grandmother     History   Social History  . Marital Status: Married    Spouse Name: N/A    Number of Children: N/A  . Years of Education: N/A   Occupational History  . Not on file.   Social History Main Topics  . Smoking status: Never Smoker   . Smokeless tobacco: Never Used     Comment: Married, lives at home with spouse & 3 dtrs. Employed as admin asst @ [employer]  . Alcohol Use: No  . Drug Use: No  . Sexual Activity: Yes    Partners: Male    Birth Control/ Protection: Surgical     Comment: Tubal   Other Topics Concern  . Not on file   Social History Narrative  . No narrative on file    ROS:  Pertinent items are noted in HPI.  PHYSICAL EXAMINATION:    BP 140/86  Pulse 68  Ht 5' 6.5" (1.689 m)  Wt 163 lb (73.936 kg)  BMI 25.92 kg/m2  LMP 06/06/2013   Wt Readings from Last 3 Encounters:  10/29/13 163 lb (73.936 kg)  08/29/13 163 lb (73.936 kg)  08/08/13 160 lb (72.576 kg)     Ht Readings from Last 3 Encounters:  10/29/13 5' 6.5" (1.689 m)  08/29/13 5' 6.5" (1.689 m)  08/08/13 5' 6.5" (1.689 m)    General appearance: alert, cooperative and appears stated age Abdomen: Pfannenstiel incision intact, soft, non-tender; no masses,  no organomegaly Neurologic: Grossly normal  Pelvic: External genitalia:  no lesions              Urethra:  normal appearing urethra with no masses, tenderness or lesions              Bartholins and Skenes: normal                 Vagina: normal appearing vagina with normal color and discharge, no lesions              Cervix:  absent                 Bimanual Exam:  Uterus:   absent                                      Adnexa: no masses  ASSESSMENT  Menopausal symptoms.  Weight gain on Estratest. Microscopic hematuria.   Bilateral lower abdominal pain.  No acute abdomen.   PLAN  Will switch to Estratest hs.  Urine culture.  Return for increasing pain. Annual examination in December after Christmas Holiday, needs to be after 04/15/14.   An After Visit Summary was printed and given to the patient.  15 minutes face to face time of which over 50% was spent in counseling.

## 2013-10-30 LAB — URINE CULTURE
COLONY COUNT: NO GROWTH
Organism ID, Bacteria: NO GROWTH

## 2013-12-03 ENCOUNTER — Other Ambulatory Visit: Payer: Self-pay | Admitting: Obstetrics and Gynecology

## 2013-12-04 NOTE — Telephone Encounter (Signed)
Last AEX 04/15/13 Last refill 08/29/13 #2/0 refills Next appt 04/10/14  Please approve or deny Rx.

## 2013-12-04 NOTE — Telephone Encounter (Signed)
Needs office visit.  Rx denied.

## 2014-01-06 ENCOUNTER — Encounter: Payer: Self-pay | Admitting: Obstetrics and Gynecology

## 2014-02-23 ENCOUNTER — Encounter: Payer: Self-pay | Admitting: Obstetrics and Gynecology

## 2014-04-10 ENCOUNTER — Encounter: Payer: Self-pay | Admitting: Obstetrics and Gynecology

## 2014-04-10 ENCOUNTER — Ambulatory Visit: Payer: 59 | Admitting: Obstetrics and Gynecology

## 2014-04-10 ENCOUNTER — Ambulatory Visit (INDEPENDENT_AMBULATORY_CARE_PROVIDER_SITE_OTHER): Payer: 59 | Admitting: Obstetrics and Gynecology

## 2014-04-10 VITALS — BP 128/60 | HR 76 | Resp 18 | Ht 66.5 in | Wt 165.6 lb

## 2014-04-10 DIAGNOSIS — Z01419 Encounter for gynecological examination (general) (routine) without abnormal findings: Secondary | ICD-10-CM

## 2014-04-10 DIAGNOSIS — Z0000 Encounter for general adult medical examination without abnormal findings: Secondary | ICD-10-CM

## 2014-04-10 DIAGNOSIS — R109 Unspecified abdominal pain: Secondary | ICD-10-CM

## 2014-04-10 DIAGNOSIS — R194 Change in bowel habit: Secondary | ICD-10-CM

## 2014-04-10 DIAGNOSIS — R350 Frequency of micturition: Secondary | ICD-10-CM

## 2014-04-10 DIAGNOSIS — R198 Other specified symptoms and signs involving the digestive system and abdomen: Secondary | ICD-10-CM

## 2014-04-10 LAB — POCT URINALYSIS DIPSTICK
Bilirubin, UA: NEGATIVE
Blood, UA: NEGATIVE
Glucose, UA: NEGATIVE
KETONES UA: NEGATIVE
Leukocytes, UA: NEGATIVE
Nitrite, UA: NEGATIVE
Protein, UA: NEGATIVE
Urobilinogen, UA: NEGATIVE
pH, UA: 5

## 2014-04-10 MED ORDER — MIRABEGRON ER 25 MG PO TB24: 25.0000 mg | ORAL_TABLET | Freq: Every day | ORAL | Status: DC

## 2014-04-10 MED ORDER — EST ESTROGENS-METHYLTEST 0.625-1.25 MG PO TABS: 1.0000 | ORAL_TABLET | Freq: Every day | ORAL | Status: DC

## 2014-04-10 NOTE — Progress Notes (Signed)
Patient ID: Linda Castro, female   DOB: May 23, 1970, 43 y.o.   MRN: [id number] 43 y.o. [id number] MarriedAfrican AmericanF here for annual exam.    Lower abdominal discomfort, upper leg discomfort bilaterally. Not emptying bowel movements well.  Stool can be liquidy or stringy like.   Pressure in lower abdomen.  Feels better with heat.  Loss of appetite. Come nausea.  No fatty food intolerance.   Started the week after Thanksgiving.   Loss of urine .  Wet bed at night once.  Has frequency.  Voids up to twice a hour during the day. Up twice at night. 1 coffee per am.  Can leak urine if coughs, laughs, or sneezes.  No pad use.   Some hot flashes but not as bad or as many.   Will see her new PCP in January.  Cholesterol was elevated for an insurance physical.   PCP:  Gwendolyn Grant, MD  Patient's last menstrual period was 06/06/2013.          Sexually active: Yes.   female The current method of family planning is tubal ligation.    Exercising: Yes.    walking. Smoker:  no  Health Maintenance: Pap:  04-15-13 wnl:neg HR HPV History of abnormal Pap:  no MMG:  06-30-13 Rt.diag.and U/S stable oval mass at 11:00--fibroadenoma. Next screening mammogram 04/2014. 05-12-13 heterogeneously dense/nl:[facility] Colonoscopy:  Had many years ago for blood in the stool and rectal bleeding.  Showed "flattened cilia." BMD:    --- TDaP:  Up to date with PCP Screening Labs:  --- Hb today: PCP, Urine today: Neg   reports that she has never smoked. She has never used smokeless tobacco. She reports that she drinks about 0.6 oz of alcohol per week. She reports that she does not use illicit drugs.  Past Medical History  Diagnosis Date  . ANEMIA, IRON DEFICIENCY 09/08/2009  . BREAST CYST, RIGHT 03/10/2010  . FATIGUE 09/08/2009  . 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 SYNCOPE 01/16/2010  . Dyspareunia   . Dysmenorrhea   . Fibroid   . Urinary incontinence   . Fibroadenoma of right breast   . HYPERTENSION  09/08/2009    history, no longer on medications  . SEIZURE DISORDER 09/08/2009    childhood history, family history of seizures    Past Surgical History  Procedure Laterality Date  . Appendectomy  2006  . Breast surgery  2011    right--fibroadenoma removed  . Cesarean section  1989, 1993    x2  . Tubal ligation  1996  . Abdominal hysterectomy Bilateral 07/01/2013    Procedure: TOTAL ABDOMINAL HYSTERECTOMY WITH BILATERAL SALPINGO OOPHORECTOMY;  Surgeon: Jamey Reas de Berton Lan, MD;  Location: [facility];  Service: Gynecology;  Laterality: Bilateral;    Current Outpatient Prescriptions  Medication Sig Dispense Refill  . estrogen-methylTESTOSTERone 0.625-1.25 MG per tablet Take 1 tablet by mouth daily. 30 tablet 5  . Multiple Vitamins-Iron (MULTIVITAMINS WITH IRON) TABS tablet Take 1 tablet by mouth daily.     No current facility-administered medications for this visit.    Family History  Problem Relation Age of Onset  . Colon cancer Other     grandparent  . Diabetes Other     parent, grandparent, other relative  . Hypertension Other     parent, other relative  . Stroke Other     parent, other relative  . Cancer Mother     peritoneal/?ovarian cells  . Ovarian cancer Mother     ?  Marland Kitchen  Hypertension Mother   . Heart attack Father   . Hypertension Father   . Hyperlipidemia Father   . Seizures Father     epilepsy  . Diabetes Sister   . Colon cancer Maternal Grandmother     ROS:  Pertinent items are noted in HPI.  Otherwise, a comprehensive ROS was negative.  Exam:   BP 128/60 mmHg  Pulse 76  Resp 18  Ht 5' 6.5" (1.689 m)  Wt 165 lb 9.6 oz (75.116 kg)  BMI 26.33 kg/m2  LMP 06/06/2013     Height: 5' 6.5" (168.9 cm)  Ht Readings from Last 3 Encounters:  04/10/14 5' 6.5" (1.689 m)  10/29/13 5' 6.5" (1.689 m)  08/29/13 5' 6.5" (1.689 m)    General appearance: alert, cooperative and appears stated age Head: Normocephalic, without obvious abnormality,  atraumatic Neck: no adenopathy, supple, symmetrical, trachea midline and thyroid normal to inspection and palpation Lungs: clear to auscultation bilaterally Breasts: Inspection negative, No nipple retraction or dimpling, No nipple discharge or bleeding, No axillary or supraclavicular adenopathy, positive findings: 1.5 cm mobile smooth mass in the right breast at 10:00.  Known fibroadenoma.  Left breast no masses. Heart: regular rate and rhythm Abdomen: soft, non-tender; bowel sounds normal; no masses,  no organomegaly Extremities: extremities normal, atraumatic, no cyanosis or edema Skin: Skin color, texture, turgor normal. No rashes or lesions Lymph nodes: Cervical, supraclavicular, and axillary nodes normal. No abnormal inguinal nodes palpated Neurologic: Grossly normal   Pelvic: External genitalia:  no lesions              Urethra:  normal appearing urethra with no masses, tenderness or lesions              Bartholins and Skenes: normal                 Vagina: normal appearing vagina with normal color and discharge, no lesions              Cervix: absent              Pap taken: No. Bimanual Exam:  Uterus:  uterus absent              Adnexa: no mass, fullness, tenderness               Rectovaginal: Confirms               Anus:  normal sphincter tone, no lesions  Chaperone was present for exam.  A:  Well Woman with normal exam Status post TAH/BSO.  On Estratest for menopausal symptoms. Right breast fibroadenoma.  Change in bowel function.  Family history of primary peritoneal tumor and colon cancer.  Overative bladder syndrome. Urinary frequency.  Genuine stress incontinence  P:   Mammogram due in January.  Patient will call for appt. Refill Estratest.  See orders.  Only able to prescribe 6 months at a time.  Discussed thromboembolic risks and breast cancer risk.  pap smear not indicated.  Refer patient to gastroenterology.  Requests Dr. Olevia Perches.  Myrbetriq 25 mg daily. See  orders.  Recheck in 6 weeks. return annually or prn  15 additional minutes face to face time of which over 50% was spent in counseling - change in bowel function, overactive bladder syndrome.

## 2014-04-10 NOTE — Patient Instructions (Signed)

## 2014-04-22 ENCOUNTER — Encounter: Payer: Self-pay | Admitting: Internal Medicine

## 2014-05-13 ENCOUNTER — Encounter: Payer: Self-pay | Admitting: Internal Medicine

## 2014-05-26 ENCOUNTER — Telehealth: Payer: Self-pay | Admitting: Obstetrics and Gynecology

## 2014-05-26 NOTE — Telephone Encounter (Signed)
Patient canceled her recheck appointment tomorrow. Patient will call later to reschedule.

## 2014-05-27 ENCOUNTER — Ambulatory Visit: Payer: Self-pay | Admitting: Obstetrics and Gynecology

## 2014-05-27 NOTE — Telephone Encounter (Signed)
Left message to call Jlyn Cerros at [phone]. 

## 2014-05-27 NOTE — Telephone Encounter (Signed)
Routed to Dr. Loma Boston to close encounter?

## 2014-05-27 NOTE — Telephone Encounter (Signed)
Please contact patient back.  If she is still on Myrbetriq, she will need a recheck.  If she is not on Myrbetriq, she does not need a recheck appointment.   Encounter was already closed.   Cc - Marisa Sprinkles

## 2014-06-04 NOTE — Telephone Encounter (Signed)
Left message to call Kaitlyn at [phone]. 

## 2014-06-09 NOTE — Telephone Encounter (Signed)
Spoke with patient. Patient states that she is not taking the Myrbetriq due to cost. "I have not gotten it filled because it was going to be $299 dollars. I just can't afford that." Patient states "If she has any alternative medications that I could try that would be great but if not I will just keep doing what I am doing." Advised will need to send a message over to Dr.Silva and return call with further recommendations. Patient is agreeable.

## 2014-06-09 NOTE — Telephone Encounter (Signed)
For overactive bladder symptoms, patient can try Ditropan XL 10 mg daily.  #30.  RF 3. Please send to her pharmacy of choice if she would like to try this. This medication can cause dry mouth and constipation.  Try sugar free hard candy to moisten mouth if needed.  Will need recheck in 6 weeks after starting medication.

## 2014-06-10 MED ORDER — OXYBUTYNIN CHLORIDE ER 10 MG PO TB24: 10.0000 mg | ORAL_TABLET | Freq: Every day | ORAL | Status: DC

## 2014-06-10 NOTE — Addendum Note (Signed)
Addended by: Rolla Etienne E on: 06/10/2014 09:30 AM   Modules accepted: Orders

## 2014-06-10 NOTE — Telephone Encounter (Signed)
Spoke with patient. Advised patient of message as seen below from Coleman. Patient would like to try Ditropan at this time. Rx for Ditropan XL 10mg  #30 3RF sent to pharmacy on file. 6 week follow up scheduled for 3/31 at 2:45pm with Dr.Silva. Patient is agreeable to date and time.   Routing to provider for final review. Patient agreeable to disposition. Will close encounter

## 2014-06-17 ENCOUNTER — Telehealth: Payer: Self-pay | Admitting: *Deleted

## 2014-06-17 NOTE — Telephone Encounter (Signed)
Recall Notes:  10 Month Mammogram Recall, 1 year since screening/tf   Last MMG:  06/30/13 IMPRESSION: 1. Stable right breast fibroadenoma. 2. No right axillary mass.  RECOMMENDATION: Bilateral screening mammogram in 10 months. That will be 1 year since mammographic evaluation of the left breast.  Pt overdue for mammogram recall.  Due January 2016.  Follow up appointment has not been made.  Please call patient to schedule.  [facility]

## 2014-06-22 NOTE — Telephone Encounter (Signed)
Attempted to contact patient by [facility] # [phone] - Wrong number. Called patient by mobile # [phone] - Invalid #. Unable to leave voicemail.

## 2014-06-22 NOTE — Telephone Encounter (Signed)
Called pt on home number in EPIC.  Pt states she is aware she needs mammogram.  [facility] will [facility] to schedule. Mobile number corrected in Demographics.

## 2014-06-23 ENCOUNTER — Ambulatory Visit (INDEPENDENT_AMBULATORY_CARE_PROVIDER_SITE_OTHER): Payer: PRIVATE HEALTH INSURANCE | Admitting: Internal Medicine

## 2014-06-23 ENCOUNTER — Encounter: Payer: Self-pay | Admitting: Internal Medicine

## 2014-06-23 VITALS — BP 138/98 | HR 68 | Ht 66.5 in | Wt 167.8 lb

## 2014-06-23 DIAGNOSIS — R1032 Left lower quadrant pain: Secondary | ICD-10-CM

## 2014-06-23 NOTE — Patient Instructions (Addendum)
Please purchase the following medications over the counter and take as directed: Benefiber take 2 tsp daily  Please continue taking a Probiotic daily  Please follow up ion 2 months Dr Sharen Counter

## 2014-06-23 NOTE — Progress Notes (Signed)
Linda Castro 19-Jun-1970 [id number]  Note: This dictation was prepared with Dragon digital system. Any transcriptional errors that result from this procedure are unintentional.   History of Present Illness: This is a  44 year old African-American female referred by Dr.Silva for evaluation of lower abdominal discomfort and pressure associated with change in the bowel habits. The change occurred after total abdominal hysterectomy and BSO one year ago. She has difficulty evacuating stool and having several B.M's in the mornings. She also has frequent urination and had actual one episode of urinary incontinence at night. She is currently on Ditropan XL at night she has a sedentary job doesn't get much exercise. Her weight has increased. She tries to eat healthy,high fiber, eats Activia  yogurt almost daily. Has a positive family history of colon cancer in maternal grandmother. I have seen her as a teenager in 44 when she had an episode of  rectal bleeding and we did a flexible sigmoidoscopy which showed  normal sigmoid colon., source was presumed anal in origin.    Past Medical History  Diagnosis Date  . ANEMIA, IRON DEFICIENCY 09/08/2009  . BREAST CYST, RIGHT 03/10/2010  . FATIGUE 09/08/2009  . 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 SYNCOPE 01/16/2010  . Dyspareunia   . Dysmenorrhea   . Fibroid   . Urinary incontinence   . Fibroadenoma of right breast   . HYPERTENSION 09/08/2009    history, no longer on medications  . SEIZURE DISORDER 09/08/2009    childhood history, family history of seizures    Past Surgical History  Procedure Laterality Date  . Appendectomy  2006  . Breast surgery  2011    right--fibroadenoma removed  . Cesarean section  1989, 1993    x2  . Tubal ligation  1996  . Abdominal hysterectomy Bilateral 07/01/2013    Procedure: TOTAL ABDOMINAL HYSTERECTOMY WITH BILATERAL SALPINGO OOPHORECTOMY;  Surgeon: Jamey Reas de Berton Lan, MD;  Location: [facility];  Service: Gynecology;   Laterality: Bilateral;    Allergies  Allergen Reactions  . Hydrocodone Nausea And Vomiting  . Oxycodone Nausea And Vomiting    Family history and social history have been reviewed.  Review of Systems: Urinary frequency. Lower abdominal discomfort. Change in bowel habits denies rectal bleeding  The remainder of the 10 point ROS is negative except as outlined in the H&P  Physical Exam: General Appearance Well developed, in no distress Eyes  Non icteric  HEENT  Non traumatic, normocephalic  Mouth No lesion, tongue papillated, no cheilosis Neck Supple without adenopathy, thyroid not enlarged, no carotid bruits, no JVD Lungs Clear to auscultation bilaterally COR Normal S1, normal S2, regular rhythm, no murmur, quiet precordium Abdomen Diffusely tender abdomen especially in the midline in suprapubic area. Well-healed suprapubic scar. Normoactive bowel sounds. No distention or tympany. Right lower quadrant and left lower quadrant mildly tender.  Rectal Not done  Extremities  No pedal edema Skin No lesions Neurological Alert and oriented x 3 Psychological Normal mood and affect  Assessment and Plan:   44 year old African-American female with change in bowel habits since total abdominal hysterectomy for fibroid tumors and she has been on estrogen replacement since then. She is still diffusely tender in the lower abdomen which may be secondary to surgery or possibly an irritable bowel syndrome. We will start  Benefiber 2 teaspoons daily to increase bulk and start probiotic daily. If not improved in next 2 months we will proceed with colonoscopy because of her age of 44 and a family history of  colon cancer in an indirect relatives. I encouraged her to try to exercise to improve her pelvic floor muscles  CC Dr Sarajane Marek 06/23/2014

## 2014-07-06 NOTE — Telephone Encounter (Signed)
Recommendation was to return to screening mammogram in 04/2014.   Removing patient from [facility] recall. Routing to provider for final review.  Closing encounter.

## 2014-07-23 ENCOUNTER — Ambulatory Visit: Payer: Self-pay | Admitting: Obstetrics and Gynecology

## 2014-07-23 ENCOUNTER — Telehealth: Payer: Self-pay | Admitting: Obstetrics and Gynecology

## 2014-07-23 NOTE — Telephone Encounter (Signed)
Patient canceled her 6 week medication follow up appointment 05/25/09 due to conflict with her work. Patient will call later to reschedule.

## 2014-07-24 ENCOUNTER — Ambulatory Visit: Payer: Self-pay | Admitting: Obstetrics and Gynecology

## 2014-07-30 NOTE — Telephone Encounter (Signed)
Called patient at [phone] and LMOVM to call me back.

## 2014-08-10 NOTE — Telephone Encounter (Signed)
Called patient and lmovm to call me back to r/s appointment. Is she taking Ditropan?  If she is she needs f/u appointment.

## 2014-08-13 NOTE — Telephone Encounter (Signed)
Dr. Quincy Simmonds, I have not received a response from patient.  I have called her twice to see if she is taking Ditropan and to try to reschedule her appointment.  No further action?

## 2014-08-13 NOTE — Telephone Encounter (Signed)
No further action needed.  I have closed the encounter.

## 2014-12-26 ENCOUNTER — Other Ambulatory Visit: Payer: Self-pay | Admitting: Obstetrics and Gynecology

## 2014-12-29 NOTE — Telephone Encounter (Signed)
Medication refill request: Estratest Last AEX:  04-10-14 Next AEX: Not yet scheduled  Last MMG (if hormonal medication request): DX MM 06-30-13 WNL Refill authorized: please advise

## 2015-01-05 ENCOUNTER — Encounter: Payer: Self-pay | Admitting: Obstetrics and Gynecology

## 2015-01-05 ENCOUNTER — Other Ambulatory Visit: Payer: Self-pay | Admitting: Obstetrics and Gynecology

## 2015-01-05 NOTE — Telephone Encounter (Signed)
Medication refill request: Linda Castro Last AEX:  04/10/14 with BS Next AEX: Not scheduled Last MMG (if hormonal medication request): 06/30/13 Dense Category C, Bi-rads 2 Benign Refill authorized: please advise.

## 2015-01-08 ENCOUNTER — Other Ambulatory Visit: Payer: Self-pay

## 2015-01-08 DIAGNOSIS — Z1231 Encounter for screening mammogram for malignant neoplasm of breast: Secondary | ICD-10-CM

## 2015-01-15 ENCOUNTER — Ambulatory Visit
Admission: RE | Admit: 2015-01-15 | Discharge: 2015-01-15 | Disposition: A | Discharge status: Home / Self Care | Payer: 59 | Source: Ambulatory Visit

## 2015-01-15 DIAGNOSIS — Z1231 Encounter for screening mammogram for malignant neoplasm of breast: Secondary | ICD-10-CM

## 2015-01-20 ENCOUNTER — Ambulatory Visit: Payer: PRIVATE HEALTH INSURANCE

## 2015-01-28 ENCOUNTER — Other Ambulatory Visit: Payer: Self-pay | Admitting: Obstetrics and Gynecology

## 2015-01-28 NOTE — Telephone Encounter (Signed)
Please contact patient to schedule annual exam in December 2016.

## 2015-01-28 NOTE — Telephone Encounter (Signed)
Medication refill request: estrogen-methylTESTOSTERone  Last AEX:  04/11/15 with BS Next AEX: Not scheduled Last MMG (if hormonal medication request): 01/15/15 dense cat c, bi-rads 2 benign Refill authorized: please advise

## 2015-01-29 NOTE — Telephone Encounter (Signed)
Patient is scheduled for an annual exam on 04/14/15 at 12:45pm.

## 2015-02-15 ENCOUNTER — Other Ambulatory Visit: Payer: Self-pay | Admitting: Obstetrics and Gynecology

## 2015-02-16 ENCOUNTER — Other Ambulatory Visit: Payer: Self-pay | Admitting: Obstetrics and Gynecology

## 2015-02-16 NOTE — Telephone Encounter (Signed)
Medication refill request: Estratest  Last AEX:  03-31-14  Next AEX: 04-14-15  Last MMG (if hormonal medication request): 01-18-15 WNL  Refill authorized: please advise

## 2015-02-17 NOTE — Telephone Encounter (Signed)
Rx for Estratest faxed to [facility] By A.Wacousta.

## 2015-04-13 ENCOUNTER — Encounter: Payer: Self-pay | Admitting: Obstetrics and Gynecology

## 2015-04-14 ENCOUNTER — Ambulatory Visit: Payer: Self-pay | Admitting: Obstetrics and Gynecology

## 2015-04-26 ENCOUNTER — Emergency Department (HOSPITAL_COMMUNITY)
Admission: EM | Admit: 2015-04-26 | Discharge: 2015-04-26 | Disposition: A | Discharge status: Home / Self Care | Payer: 59 | Attending: Emergency Medicine | Admitting: Emergency Medicine

## 2015-04-26 ENCOUNTER — Encounter (HOSPITAL_COMMUNITY): Payer: Self-pay | Admitting: Emergency Medicine

## 2015-04-26 ENCOUNTER — Emergency Department (HOSPITAL_COMMUNITY): Payer: 59

## 2015-04-26 DIAGNOSIS — M79602 Pain in left arm: Secondary | ICD-10-CM

## 2015-04-26 DIAGNOSIS — Z79818 Long term (current) use of other agents affecting estrogen receptors and estrogen levels: Secondary | ICD-10-CM | POA: Insufficient documentation

## 2015-04-26 DIAGNOSIS — Z79899 Other long term (current) drug therapy: Secondary | ICD-10-CM | POA: Diagnosis not present

## 2015-04-26 DIAGNOSIS — Z862 Personal history of diseases of the blood and blood-forming organs and certain disorders involving the immune mechanism: Secondary | ICD-10-CM | POA: Diagnosis not present

## 2015-04-26 DIAGNOSIS — I10 Essential (primary) hypertension: Secondary | ICD-10-CM

## 2015-04-26 DIAGNOSIS — Z8742 Personal history of other diseases of the female genital tract: Secondary | ICD-10-CM | POA: Diagnosis not present

## 2015-04-26 DIAGNOSIS — M79622 Pain in left upper arm: Secondary | ICD-10-CM | POA: Insufficient documentation

## 2015-04-26 DIAGNOSIS — Z8669 Personal history of other diseases of the nervous system and sense organs: Secondary | ICD-10-CM | POA: Insufficient documentation

## 2015-04-26 DIAGNOSIS — Z86018 Personal history of other benign neoplasm: Secondary | ICD-10-CM | POA: Diagnosis not present

## 2015-04-26 LAB — CBC
HCT: 44.4 % (ref 36.0–46.0)
Hemoglobin: 14.2 g/dL (ref 12.0–15.0)
MCH: 27 pg (ref 26.0–34.0)
MCHC: 32 g/dL (ref 30.0–36.0)
MCV: 84.4 fL (ref 78.0–100.0)
PLATELETS: 204 10*3/uL (ref 150–400)
RBC: 5.26 MIL/uL — AB (ref 3.87–5.11)
RDW: 13.7 % (ref 11.5–15.5)
WBC: 5.7 10*3/uL (ref 4.0–10.5)

## 2015-04-26 LAB — I-STAT TROPONIN, ED
Troponin i, poc: 0 ng/mL (ref 0.00–0.08)
Troponin i, poc: 0 ng/mL (ref 0.00–0.08)

## 2015-04-26 LAB — BASIC METABOLIC PANEL
Anion gap: 8 (ref 5–15)
BUN: 13 mg/dL (ref 6–20)
CHLORIDE: 104 mmol/L (ref 101–111)
CO2: 29 mmol/L (ref 22–32)
CREATININE: 0.72 mg/dL (ref 0.44–1.00)
Calcium: 9.5 mg/dL (ref 8.9–10.3)
GFR calc Af Amer: >60 mL/min (ref >60)
GFR calc non Af Amer: >60 mL/min (ref >60)
GLUCOSE: 101 mg/dL — AB (ref 65–99)
Potassium: 3.3 mmol/L — ABNORMAL LOW (ref 3.5–5.1)
Sodium: 141 mmol/L (ref 135–145)

## 2015-04-26 MED ORDER — HYDROCHLOROTHIAZIDE 25 MG PO TABS: 25.0000 mg | ORAL_TABLET | Freq: Every day | ORAL | Status: DC

## 2015-04-26 NOTE — ED Provider Notes (Signed)
CSN: [id number]     Arrival date & time 04/26/15  1242 History   First MD Initiated Contact with Patient 04/26/15 1607     Chief Complaint  Patient presents with  . Arm Pain  . Hypertension     (Consider location/radiation/quality/duration/timing/severity/associated sxs/prior Treatment) HPI   Linda Castro is a(n) 45 y.o. female who presents to emergency department with chief complaint of hypertension. Patient states that she checked it at home and has been running high. She has a previous history of hypertension. 7 years ago, which she reduced with lifestyle modifications and weight loss. The patient has had intermittent left arm pain and heaviness and achiness. She denies chest pain or shortness of breath. Patient was concerned about these symptoms and her husband brought her into the emergency department today. She has a family history of hypertension. She has a strong family history of MI on her father's side. She denies any smoking history, diabetes, high cholesterol  Past Medical History  Diagnosis Date  . ANEMIA, IRON DEFICIENCY 09/08/2009  . BREAST CYST, RIGHT 03/10/2010  . FATIGUE 09/08/2009  . 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 SYNCOPE 01/16/2010  . Dyspareunia   . Dysmenorrhea   . Fibroid   . Urinary incontinence   . Fibroadenoma of right breast   . HYPERTENSION 09/08/2009    history, no longer on medications  . SEIZURE DISORDER 09/08/2009    childhood history, family history of seizures   Past Surgical History  Procedure Laterality Date  . Appendectomy  2006  . Breast surgery  2011    right--fibroadenoma removed  . Cesarean section  1989, 1993    x2  . Tubal ligation  1996  . Abdominal hysterectomy Bilateral 07/01/2013    Procedure: TOTAL ABDOMINAL HYSTERECTOMY WITH BILATERAL SALPINGO OOPHORECTOMY;  Surgeon: Jamey Reas de Berton Lan, MD;  Location: [facility];  Service: Gynecology;  Laterality: Bilateral;   Family History  Problem Relation Age of Onset  . Colon  cancer Other     grandparent  . Diabetes Other     parent, grandparent, other relative  . Hypertension Other     parent, other relative  . Stroke Other     parent, other relative  . Cancer Mother     peritoneal/?ovarian cells  . Ovarian cancer Mother     ?  Marland Kitchen Hypertension Mother   . Heart attack Father   . Hypertension Father   . Hyperlipidemia Father   . Seizures Father     epilepsy  . Diabetes Sister   . Colon cancer Maternal Grandmother    Social History  Substance Use Topics  . Smoking status: Never Smoker   . Smokeless tobacco: Never Used     Comment: Married, lives at home with spouse & 3 dtrs. Employed as admin asst @ [employer]  . Alcohol Use: 0.6 oz/week    1 Standard drinks or equivalent per week   OB History    Gravida Para Term Preterm AB TAB SAB Ectopic Multiple Living   3 3 3       3      Review of Systems  Ten systems reviewed and are negative for acute change, except as noted in the HPI.    Allergies  Hydrocodone and Oxycodone  Home Medications   Prior to Admission medications   Medication Sig Start Date End Date Taking? Authorizing Provider  estrogen-methylTESTOSTERone (ESTRATEST) 1.25-2.5 MG tablet TAKE 1 TABLET EVERY DAY Patient taking differently: Take 1 tablet every day.  02/17/15  Yes [place], MD  Multiple Vitamins-Iron (MULTIVITAMINS WITH IRON) TABS tablet Take 1 tablet by mouth daily.   Yes Historical Provider, MD   BP 171/113 mmHg  Pulse 92  Temp(Src) 98 F (36.7 C) (Oral)  Resp 17  SpO2 100%  LMP 06/06/2013 Physical Exam  Constitutional: She is oriented to person, place, and time. She appears well-developed and well-nourished. No distress.  HENT:  Head: Normocephalic and atraumatic.  Eyes: Conjunctivae are normal. No scleral icterus.  Neck: Normal range of motion.  Cardiovascular: Normal rate, regular rhythm and normal heart sounds.  Exam reveals no gallop and no friction rub.   No murmur  heard. Pulmonary/Chest: Effort normal and breath sounds normal. No respiratory distress.  Abdominal: Soft. Bowel sounds are normal. She exhibits no distension and no mass. There is no tenderness. There is no guarding.  Neurological: She is alert and oriented to person, place, and time.  Skin: Skin is warm and dry. She is not diaphoretic.    ED Course  Procedures (including critical care time) Labs Review Labs Reviewed  BASIC METABOLIC PANEL - Abnormal; Notable for the following:    Potassium 3.3 (*)    Glucose, Bld 101 (*)    All other components within normal limits  CBC - Abnormal; Notable for the following:    RBC 5.26 (*)    All other components within normal limits  I-STAT TROPOININ, ED  Randolm Idol, ED    Imaging Review Dg Chest 2 View  04/26/2015  CLINICAL DATA:  Left arm pain today EXAM: CHEST  2 VIEW COMPARISON:  05/29/2006 FINDINGS: Mild cardiomegaly. Normal vascularity. No pneumothorax or pleural effusion. Mild irregular pleural thickening at the lung apices is stable. IMPRESSION: Cardiomegaly without decompensation. Electronically Signed   By: Marybelle Killings M.D.   On: 04/26/2015 13:40   I have personally reviewed and evaluated these images and lab results as part of my medical decision-making.   EKG Interpretation None      MDM   Final diagnoses:  Essential hypertension  Pain of left upper extremity    Patient with normal musculoskeletal examination of the left upper extremity. She has no sign of end organ damage. Patient's blood pressure decreased while waiting here in the emergency department. We will begin the patient on hydrochlorothiazide 25 mg. It did discuss the fact that she should be followed closely by her PCP closely to manage her blood pressure long-term. I discussed this with the patient to feels comfortable with discharge at this time. No concern for MI. And has no arm pain or chest pain during her visit today.    Margarita Mail, PA-C 04/28/15  0214  Daleen Bo, MD 04/28/15 [id number]

## 2015-04-26 NOTE — ED Notes (Signed)
Pt c/o left arm heaviness/numbness/pain onset today at 0830, polydipsia, hypertension.  Pt reports intermittent pain to left arm this past week.

## 2015-04-26 NOTE — Discharge Instructions (Signed)
DASH Eating Plan °DASH stands for "Dietary Approaches to Stop Hypertension." The DASH eating plan is a healthy eating plan that has been shown to reduce high blood pressure (hypertension). Additional health benefits may include reducing the risk of type 2 diabetes mellitus, heart disease, and stroke. The DASH eating plan may also help with weight loss. °WHAT DO I NEED TO KNOW ABOUT THE DASH EATING PLAN? °For the DASH eating plan, you will follow these general guidelines: °· Choose foods with a percent daily value for sodium of less than 5% (as listed on the food label). °· Use salt-free seasonings or herbs instead of table salt or sea salt. °· Check with your health care provider or pharmacist before using salt substitutes. °· Eat lower-sodium products, often labeled as "lower sodium" or "no salt added." °· Eat fresh foods. °· Eat more vegetables, fruits, and low-fat dairy products. °· Choose whole grains. Look for the word "whole" as the first word in the ingredient list. °· Choose fish and skinless chicken or turkey more often than red meat. Limit fish, poultry, and meat to 6 oz (170 g) each day. °· Limit sweets, desserts, sugars, and sugary drinks. °· Choose heart-healthy fats. °· Limit cheese to 1 oz (28 g) per day. °· Eat more home-cooked food and less restaurant, buffet, and fast food. °· Limit fried foods. °· Cook foods using methods other than frying. °· Limit canned vegetables. If you do use them, rinse them well to decrease the sodium. °· When eating at a restaurant, ask that your food be prepared with less salt, or no salt if possible. °WHAT FOODS CAN I EAT? °Seek help from a dietitian for individual calorie needs. °Grains °Whole grain or whole wheat bread. Brown rice. Whole grain or whole wheat pasta. Quinoa, bulgur, and whole grain cereals. Low-sodium cereals. Corn or whole wheat flour tortillas. Whole grain cornbread. Whole grain crackers. Low-sodium crackers. °Vegetables °Fresh or frozen vegetables  (raw, steamed, roasted, or grilled). Low-sodium or reduced-sodium tomato and vegetable juices. Low-sodium or reduced-sodium tomato sauce and paste. Low-sodium or reduced-sodium canned vegetables.  °Fruits °All fresh, canned (in natural juice), or frozen fruits. °Meat and Other Protein Products °Ground beef (85% or leaner), grass-fed beef, or beef trimmed of fat. Skinless chicken or turkey. Ground chicken or turkey. Pork trimmed of fat. All fish and seafood. Eggs. Dried beans, peas, or lentils. Unsalted nuts and seeds. Unsalted canned beans. °Dairy °Low-fat dairy products, such as skim or 1% milk, 2% or reduced-fat cheeses, low-fat ricotta or cottage cheese, or plain low-fat yogurt. Low-sodium or reduced-sodium cheeses. °Fats and Oils °Tub margarines without trans fats. Light or reduced-fat mayonnaise and salad dressings (reduced sodium). Avocado. Safflower, olive, or canola oils. Natural peanut or almond butter. °Other °Unsalted popcorn and pretzels. °The items listed above may not be a complete list of recommended foods or beverages. Contact your dietitian for more options. °WHAT FOODS ARE NOT RECOMMENDED? °Grains °White bread. White pasta. White rice. Refined cornbread. Bagels and croissants. Crackers that contain trans fat. °Vegetables °Creamed or fried vegetables. Vegetables in a cheese sauce. Regular canned vegetables. Regular canned tomato sauce and paste. Regular tomato and vegetable juices. °Fruits °Dried fruits. Canned fruit in light or heavy syrup. Fruit juice. °Meat and Other Protein Products °Fatty cuts of meat. Ribs, chicken wings, bacon, sausage, bologna, salami, chitterlings, fatback, hot dogs, bratwurst, and packaged luncheon meats. Salted nuts and seeds. Canned beans with salt. °Dairy °Whole or 2% milk, cream, half-and-half, and cream cheese. Whole-fat or sweetened yogurt. Full-fat   cheeses or blue cheese. Nondairy creamers and whipped toppings. Processed cheese, cheese spreads, or cheese  curds. °Condiments °Onion and garlic salt, seasoned salt, table salt, and sea salt. Canned and packaged gravies. Worcestershire sauce. Tartar sauce. Barbecue sauce. Teriyaki sauce. Soy sauce, including reduced sodium. Steak sauce. Fish sauce. Oyster sauce. Cocktail sauce. Horseradish. Ketchup and mustard. Meat flavorings and tenderizers. Bouillon cubes. Hot sauce. Tabasco sauce. Marinades. Taco seasonings. Relishes. °Fats and Oils °Butter, stick margarine, lard, shortening, ghee, and bacon fat. Coconut, palm kernel, or palm oils. Regular salad dressings. °Other °Pickles and olives. Salted popcorn and pretzels. °The items listed above may not be a complete list of foods and beverages to avoid. Contact your dietitian for more information. °WHERE CAN I FIND MORE INFORMATION? °[employer], [employer], [employer]: www.nhlbi.nih.gov/health/health-topics/topics/dash/ °  °This information is not intended to replace advice given to you by your health care provider. Make sure you discuss any questions you have with your health care provider. °  °Document Released: 03/30/2011 Document Revised: 05/01/2014 Document Reviewed: 02/12/2013 °Elsevier Interactive Patient Education ©2016 Elsevier Inc. ° °Hypertension °Hypertension, commonly called high blood pressure, is when the force of blood pumping through your arteries is too strong. Your arteries are the blood vessels that carry blood from your heart throughout your body. A blood pressure reading consists of a higher number over a lower number, such as 110/72. The higher number (systolic) is the pressure inside your arteries when your heart pumps. The lower number (diastolic) is the pressure inside your arteries when your heart relaxes. Ideally you want your blood pressure below 120/80. °Hypertension forces your heart to work harder to pump blood. Your arteries may become narrow or stiff. Having untreated or uncontrolled hypertension can cause heart attack, stroke, kidney  disease, and other problems. °RISK FACTORS °Some risk factors for high blood pressure are controllable. Others are not.  °Risk factors you cannot control include:  °· Race. You may be at higher risk if you are African American. °· Age. Risk increases with age. °· Gender. Men are at higher risk than women before age 45 years. After age 65, women are at higher risk than men. °Risk factors you can control include: °· Not getting enough exercise or physical activity. °· Being overweight. °· Getting too much fat, sugar, calories, or salt in your diet. °· Drinking too much alcohol. °SIGNS AND SYMPTOMS °Hypertension does not usually cause signs or symptoms. Extremely high blood pressure (hypertensive crisis) may cause headache, anxiety, shortness of breath, and nosebleed. °DIAGNOSIS °To check if you have hypertension, your health care provider will measure your blood pressure while you are seated, with your arm held at the level of your heart. It should be measured at least twice using the same arm. Certain conditions can cause a difference in blood pressure between your right and left arms. A blood pressure reading that is higher than normal on one occasion does not mean that you need treatment. If it is not clear whether you have high blood pressure, you may be asked to return on a different day to have your blood pressure checked again. Or, you may be asked to monitor your blood pressure at home for 1 or more weeks. °TREATMENT °Treating high blood pressure includes making lifestyle changes and possibly taking medicine. Living a healthy lifestyle can help lower high blood pressure. You may need to change some of your habits. °Lifestyle changes may include: °· Following the DASH diet. This diet is high in fruits, vegetables, and whole   grains. It is low in salt, red meat, and added sugars.  Keep your sodium intake below 2,300 mg per day.  Getting at least 30-45 minutes of aerobic exercise at least 4 times per  week.  Losing weight if necessary.  Not smoking.  Limiting alcoholic beverages.  Learning ways to reduce stress. Your health care provider may prescribe medicine if lifestyle changes are not enough to get your blood pressure under control, and if one of the following is true:  You are 28-19 years of age and your systolic blood pressure is above 140.  You are 41 years of age or older, and your systolic blood pressure is above 150.  Your diastolic blood pressure is above 90.  You have diabetes, and your systolic blood pressure is over [id number] or your diastolic blood pressure is over 90.  You have kidney disease and your blood pressure is above 140/90.  You have heart disease and your blood pressure is above 140/90. Your personal target blood pressure may vary depending on your medical conditions, your age, and other factors. HOME CARE INSTRUCTIONS  Have your blood pressure rechecked as directed by your health care provider.   Take medicines only as directed by your health care provider. Follow the directions carefully. Blood pressure medicines must be taken as prescribed. The medicine does not work as well when you skip doses. Skipping doses also puts you at risk for problems.  Do not smoke.   Monitor your blood pressure at home as directed by your health care provider. SEEK MEDICAL CARE IF:   You think you are having a reaction to medicines taken.  You have recurrent headaches or feel dizzy.  You have swelling in your ankles.  You have trouble with your vision. SEEK IMMEDIATE MEDICAL CARE IF:  You develop a severe headache or confusion.  You have unusual weakness, numbness, or feel faint.  You have severe chest or abdominal pain.  You vomit repeatedly.  You have trouble breathing. MAKE SURE YOU:   Understand these instructions.  Will watch your condition.  Will get help right away if you are not doing well or get worse.   This information is not intended to  replace advice given to you by your health care provider. Make sure you discuss any questions you have with your health care provider.   Document Released: 04/10/2005 Document Revised: 08/25/2014 Document Reviewed: 01/31/2013 Elsevier Interactive Patient Education 2016 Elsevier Inc. Hydrochlorothiazide, HCTZ capsules or tablets What is this medicine? HYDROCHLOROTHIAZIDE (hye droe klor oh THYE a zide) is a diuretic. It increases the amount of urine passed, which causes the body to lose salt and water. This medicine is used to treat high blood pressure. It is also reduces the swelling and water retention caused by various medical conditions, such as heart, liver, or kidney disease. This medicine may be used for other purposes; ask your health care provider or pharmacist if you have questions. What should I tell my health care provider before I take this medicine? They need to know if you have any of these conditions: -diabetes -gout -immune system problems, like lupus -kidney disease or kidney stones -liver disease -pancreatitis -small amount of urine or difficulty passing urine -an unusual or allergic reaction to hydrochlorothiazide, sulfa drugs, other medicines, foods, dyes, or preservatives -pregnant or trying to get pregnant -breast-feeding How should I use this medicine? Take this medicine by mouth with a glass of water. Follow the directions on the prescription label. Take your medicine at regular  intervals. Remember that you will need to pass urine frequently after taking this medicine. Do not take your doses at a time of day that will cause you problems. Do not stop taking your medicine unless your doctor tells you to. Talk to your pediatrician regarding the use of this medicine in children. Special care may be needed. Overdosage: If you think you have taken too much of this medicine contact a poison control center or emergency room at once. NOTE: This medicine is only for you. Do not  share this medicine with others. What if I miss a dose? If you miss a dose, take it as soon as you can. If it is almost time for your next dose, take only that dose. Do not take double or extra doses. What may interact with this medicine? -cholestyramine -colestipol -digoxin -dofetilide -lithium -medicines for blood pressure -medicines for diabetes -medicines that relax muscles for surgery -other diuretics -steroid medicines like prednisone or cortisone This list may not describe all possible interactions. Give your health care provider a list of all the medicines, herbs, non-prescription drugs, or dietary supplements you use. Also tell them if you smoke, drink alcohol, or use illegal drugs. Some items may interact with your medicine. What should I watch for while using this medicine? Visit your doctor or health care professional for regular checks on your progress. Check your blood pressure as directed. Ask your doctor or health care professional what your blood pressure should be and when you should contact him or her. You may need to be on a special diet while taking this medicine. Ask your doctor. Check with your doctor or health care professional if you get an attack of severe diarrhea, nausea and vomiting, or if you sweat a lot. The loss of too much body fluid can make it dangerous for you to take this medicine. You may get drowsy or dizzy. Do not drive, use machinery, or do anything that needs mental alertness until you know how this medicine affects you. Do not stand or sit up quickly, especially if you are an older patient. This reduces the risk of dizzy or fainting spells. Alcohol may interfere with the effect of this medicine. Avoid alcoholic drinks. This medicine may affect your blood sugar level. If you have diabetes, check with your doctor or health care professional before changing the dose of your diabetic medicine. This medicine can make you more sensitive to the sun. Keep out  of the sun. If you cannot avoid being in the sun, wear protective clothing and use sunscreen. Do not use sun lamps or tanning beds/booths. What side effects may I notice from receiving this medicine? Side effects that you should report to your doctor or health care professional as soon as possible: -allergic reactions such as skin rash or itching, hives, swelling of the lips, mouth, tongue, or throat -changes in vision -chest pain -eye pain -fast or irregular heartbeat -feeling faint or lightheaded, falls -gout attack -muscle pain or cramps -pain or difficulty when passing urine -pain, tingling, numbness in the hands or feet -redness, blistering, peeling or loosening of the skin, including inside the mouth -unusually weak or tired Side effects that usually do not require medical attention (report to your doctor or health care professional if they continue or are bothersome): -change in sex drive or performance -dry mouth -headache -stomach upset This list may not describe all possible side effects. Call your doctor for medical advice about side effects. You may report side effects to FDA  at 1-800-FDA-1088. Where should I keep my medicine? Keep out of the reach of children. Store at room temperature between 15 and 30 degrees C (59 and 86 degrees F). Do not freeze. Protect from light and moisture. Keep container closed tightly. Throw away any unused medicine after the expiration date. NOTE: This sheet is a summary. It may not cover all possible information. If you have questions about this medicine, talk to your doctor, pharmacist, or health care provider.    2016, Elsevier/Gold Standard. (2009-12-03 12:57:37)

## 2015-04-30 ENCOUNTER — Ambulatory Visit
Admission: RE | Admit: 2015-04-30 | Discharge: 2015-04-30 | Disposition: A | Discharge status: Home / Self Care | Payer: 59 | Source: Ambulatory Visit | Attending: Internal Medicine | Admitting: Internal Medicine

## 2015-04-30 ENCOUNTER — Other Ambulatory Visit: Payer: Self-pay | Admitting: Internal Medicine

## 2015-04-30 DIAGNOSIS — R200 Anesthesia of skin: Secondary | ICD-10-CM

## 2015-04-30 DIAGNOSIS — H532 Diplopia: Secondary | ICD-10-CM

## 2015-04-30 DIAGNOSIS — R202 Paresthesia of skin: Secondary | ICD-10-CM

## 2015-05-26 ENCOUNTER — Encounter: Payer: Self-pay | Admitting: Obstetrics and Gynecology

## 2015-05-26 ENCOUNTER — Ambulatory Visit (INDEPENDENT_AMBULATORY_CARE_PROVIDER_SITE_OTHER): Payer: 59 | Admitting: Obstetrics and Gynecology

## 2015-05-26 DIAGNOSIS — Z0000 Encounter for general adult medical examination without abnormal findings: Secondary | ICD-10-CM

## 2015-05-26 DIAGNOSIS — Z01419 Encounter for gynecological examination (general) (routine) without abnormal findings: Secondary | ICD-10-CM | POA: Diagnosis not present

## 2015-05-26 DIAGNOSIS — R6882 Decreased libido: Secondary | ICD-10-CM | POA: Diagnosis not present

## 2015-05-26 LAB — POCT URINALYSIS DIPSTICK
BILIRUBIN UA: NEGATIVE
Glucose, UA: NEGATIVE
KETONES UA: NEGATIVE
LEUKOCYTES UA: NEGATIVE
Nitrite, UA: NEGATIVE
PH UA: 5
Protein, UA: NEGATIVE
Urobilinogen, UA: NEGATIVE

## 2015-05-26 MED ORDER — ESTRADIOL 1 MG PO TABS: 1.0000 mg | ORAL_TABLET | Freq: Every day | ORAL | Status: DC

## 2015-05-26 NOTE — Progress Notes (Signed)
45 y.o. G57P3003 Married [place] American female here for annual exam.    On Estratest.  Having hot flashes still but they are less.  Has them more at night. Libido is not good.  Emotional changes.  Feels stressed a lot and overwhelmed. Denies depression and denies suicidal ideation.   Blood pressure elevated at home and had glucose of 150 with husband's glucometer. Hgb A1C 5.7. Started on antiHTN Rx for elevated blood pressure.   PCP:   Trilby Drummer  Patient's last menstrual period was 06/06/2013.          Sexually active: Yes.    The current method of family planning is status post hysterectomy.    Exercising: Yes.    Gym/ health club routine includes 3 days a week at the gym//kg. Smoker:  no  Health Maintenance: Pap:  04/15/13 Neg HR HPV Neg History of abnormal Pap:  no MMG:  01/15/15 BIRADS Category 1 Negative Colonoscopy: Had many years ago for blood in the stool and rectal bleeding. Showed "flattened cilia." BMD:   NA Result  Na TDaP:  2014 Screening Labs:  Hb today: PCP, Urine today: trace RBCs.  Some lower abdominal discomfort 1.5 weeks ago.  No dysuria.  No hx of UTIs.   reports that she has never smoked. She has never used smokeless tobacco. She reports that she drinks about 0.6 oz of alcohol per week. She reports that she does not use illicit drugs.  Past Medical History  Diagnosis Date  . ANEMIA, IRON DEFICIENCY 09/08/2009  . BREAST CYST, RIGHT 03/10/2010  . FATIGUE 09/08/2009  . 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 SYNCOPE 01/16/2010  . Dyspareunia   . Dysmenorrhea   . Fibroid   . Urinary incontinence   . Fibroadenoma of right breast   . HYPERTENSION 09/08/2009    history, no longer on medications  . SEIZURE DISORDER 09/08/2009    childhood history, family history of seizures    Past Surgical History  Procedure Laterality Date  . Appendectomy  2006  . Breast surgery  2011    right--fibroadenoma removed  . Cesarean section  1989, 1993    x2  . Tubal ligation  1996   . Abdominal hysterectomy Bilateral 07/01/2013    Procedure: TOTAL ABDOMINAL HYSTERECTOMY WITH BILATERAL SALPINGO OOPHORECTOMY;  Surgeon: Jamey Reas de Berton Lan, MD;  Location: [facility];  Service: Gynecology;  Laterality: Bilateral;    Current Outpatient Prescriptions  Medication Sig Dispense Refill  . estrogen-methylTESTOSTERone (ESTRATEST) 1.25-2.5 MG tablet TAKE 1 TABLET EVERY DAY (Patient taking differently: Take 1 tablet every day.) 30 tablet 1  . hydrochlorothiazide (HYDRODIURIL) 25 MG tablet Take 1 tablet (25 mg total) by mouth daily. 30 tablet 0  . losartan (COZAAR) 50 MG tablet Take 50 mg by mouth daily.  2  . Multiple Vitamins-Iron (MULTIVITAMINS WITH IRON) TABS tablet Take 1 tablet by mouth daily.     No current facility-administered medications for this visit.    Family History  Problem Relation Age of Onset  . Colon cancer Other     grandparent  . Diabetes Other     parent, grandparent, other relative  . Hypertension Other     parent, other relative  . Stroke Other     parent, other relative  . Cancer Mother     peritoneal/?ovarian cells  . Ovarian cancer Mother     ?  Marland Kitchen Hypertension Mother   . Heart attack Father   . Hypertension Father   . Hyperlipidemia Father   .  Seizures Father     epilepsy  . Diabetes Sister   . Colon cancer Maternal Grandmother     ROS:  Pertinent items are noted in HPI.  Otherwise, a comprehensive ROS was negative.  Exam:   LMP 06/06/2013    General appearance: alert, cooperative and appears stated age Head: Normocephalic, without obvious abnormality, atraumatic Neck: no adenopathy, supple, symmetrical, trachea midline and thyroid normal to inspection and palpation Lungs: clear to auscultation bilaterally Breasts: normal appearance, no masses or tenderness, Inspection negative, No nipple retraction or dimpling, No nipple discharge or bleeding, No axillary or supraclavicular adenopathy Heart: regular rate and  rhythm Abdomen: soft, non-tender; bowel sounds normal; no masses,  no organomegaly Extremities: extremities normal, atraumatic, no cyanosis or edema Skin: Skin color, texture, turgor normal. No rashes or lesions Lymph nodes: Cervical, supraclavicular, and axillary nodes normal. No abnormal inguinal nodes palpated Neurologic: Grossly normal  Pelvic: External genitalia:  no lesions              Urethra:  normal appearing urethra with no masses, tenderness or lesions              Bartholins and Skenes: normal                 Vagina: normal appearing vagina with normal color and discharge, no lesions              Cervix: absent              Pap taken: No. Bimanual Exam:  Uterus:  uterus absent              Adnexa: no mass, fullness, tenderness              Rectovaginal: Yes.  .  Confirms.              Anus:  normal sphincter tone, no lesions  Chaperone was present for exam.  Assessment:   Well woman visit with normal exam. Menopausal symptoms.  Decreased libido. HTN.  Elevated hemoglobin A1C.  Plan: Yearly mammogram recommended after age 54.  Recommended self breast exam.  Pap and HR HPV as above. Discussed Calcium, Vitamin D, regular exercise program including cardiovascular and weight bearing exercise. Labs performed.  No..   See orders. Refills given on medications.  Yes.  .  See orders.  Will switch to Estrace 1 mg daily.   Discussed risks of thromboembolic events and breast cancer.  Patient will return for a testosterone level.   After checking, can then initiate testosterone treatment.  She declines counseling for stress management.   Follow up annually and prn.      After visit summary provided.

## 2015-05-26 NOTE — Patient Instructions (Signed)

## 2015-06-28 ENCOUNTER — Ambulatory Visit (INDEPENDENT_AMBULATORY_CARE_PROVIDER_SITE_OTHER): Payer: 59 | Admitting: Obstetrics and Gynecology

## 2015-06-28 ENCOUNTER — Telehealth: Payer: Self-pay | Admitting: Obstetrics and Gynecology

## 2015-06-28 ENCOUNTER — Encounter: Payer: Self-pay | Admitting: Obstetrics and Gynecology

## 2015-06-28 VITALS — BP 126/82 | HR 76 | Temp 98.3°F | Resp 14 | Ht 66.5 in | Wt 174.0 lb

## 2015-06-28 DIAGNOSIS — R300 Dysuria: Secondary | ICD-10-CM | POA: Diagnosis not present

## 2015-06-28 DIAGNOSIS — N949 Unspecified condition associated with female genital organs and menstrual cycle: Secondary | ICD-10-CM

## 2015-06-28 LAB — POCT URINALYSIS DIPSTICK
Bilirubin, UA: NEGATIVE
Glucose, UA: NEGATIVE
Ketones, UA: NEGATIVE
Nitrite, UA: NEGATIVE
PROTEIN UA: NEGATIVE
Urobilinogen, UA: NEGATIVE
pH, UA: 7

## 2015-06-28 MED ORDER — FLUCONAZOLE 150 MG PO TABS: 150.0000 mg | ORAL_TABLET | Freq: Once | ORAL | Status: DC

## 2015-06-28 MED ORDER — SULFAMETHOXAZOLE-TRIMETHOPRIM 800-160 MG PO TABS: 1.0000 | ORAL_TABLET | Freq: Two times a day (BID) | ORAL | Status: DC

## 2015-06-28 NOTE — Telephone Encounter (Signed)
Spoke with patient. Patient states that yesterday she went to the restroom and noticed she has a vaginal discharge and blood when she wipes. Reports lower back pain and slight vaginal itching. Denies fever. Reports two nights ago she was experiencing chills. "I was not sure if that was related to my hormones or not." Denies any current chills. Advised she will need to be seen in the office for further evaluation. She is agreeable. Appointment scheduled for today 06/28/2015 at 1:15 pm with Dr.Silva. She is agreeable to date and time.  Routing to provider for final review. Patient agreeable to disposition. Will close encounter.

## 2015-06-28 NOTE — Telephone Encounter (Signed)
Patient wants to speak with nurse no information given. °

## 2015-06-28 NOTE — Progress Notes (Signed)
GYNECOLOGY  VISIT   HPI: 45 y.o.   Married  [place] American  female   [phone] with Patient's last menstrual period was 06/06/2013.   here for   UTI Sx  Had vaginal spotting 2 weeks ago with wiping after urination.  4 nights ago had spotting and chills.  Recurrent spotting again last hs and lower abdominal discomfort and lower back discomfort. Having urgency and frequency.  No nausea or vomiting.  Decreased appetite. Bowel function is normal.  No fever.   Tried Vagisil cream and this did not help. No vaginal discharge.  Urine with trace RBCs and trace WBCs.   GYNECOLOGIC HISTORY: Patient's last menstrual period was 06/06/2013. Contraception: Hysterectomy  Menopausal hormone therapy:  Estrace 1 mg daily Last mammogram: 01/18/15 BIRADS2:Benign  Last pap smear: 04/15/13 Neg. HR HPV:neg        OB History    Gravida Para Term Preterm AB TAB SAB Ectopic Multiple Living   3 3 3       3          Patient Active Problem List   Diagnosis Date Noted  . BREAST CYST, RIGHT 03/10/2010  . 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 SYNCOPE 01/16/2010  . ANEMIA, IRON DEFICIENCY 09/08/2009  . HYPERTENSION 09/08/2009  . SEIZURE DISORDER 09/08/2009  . FATIGUE 09/08/2009    Past Medical History  Diagnosis Date  . ANEMIA, IRON DEFICIENCY 09/08/2009  . BREAST CYST, RIGHT 03/10/2010  . FATIGUE 09/08/2009  . 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 SYNCOPE 01/16/2010  . Dyspareunia   . Dysmenorrhea   . Fibroid   . Urinary incontinence   . Fibroadenoma of right breast   . HYPERTENSION 09/08/2009    history, no longer on medications  . SEIZURE DISORDER 09/08/2009    childhood history, family history of seizures    Past Surgical History  Procedure Laterality Date  . Appendectomy  2006  . Breast surgery  2011    right--fibroadenoma removed  . Cesarean section  1989, 1993    x2  . Tubal ligation  1996  . Abdominal hysterectomy Bilateral 07/01/2013    Procedure: TOTAL ABDOMINAL HYSTERECTOMY WITH BILATERAL SALPINGO  OOPHORECTOMY;  Surgeon: Jamey Reas de Berton Lan, MD;  Location: [facility];  Service: Gynecology;  Laterality: Bilateral;    Current Outpatient Prescriptions  Medication Sig Dispense Refill  . estradiol (ESTRACE) 1 MG tablet Take 1 tablet (1 mg total) by mouth daily. 30 tablet 11  . hydrochlorothiazide (HYDRODIURIL) 25 MG tablet Take 1 tablet (25 mg total) by mouth daily. 30 tablet 0  . losartan (COZAAR) 50 MG tablet Take 50 mg by mouth daily.  2  . Multiple Vitamins-Iron (MULTIVITAMINS WITH IRON) TABS tablet Take 1 tablet by mouth daily.    . traMADol (ULTRAM) 50 MG tablet Take 50 mg by mouth daily as needed.     No current facility-administered medications for this visit.     ALLERGIES: Hydrocodone; Oxycodone; and Lisinopril-hydrochlorothiazide  Family History  Problem Relation Age of Onset  . Colon cancer Other     grandparent  . Diabetes Other     parent, grandparent, other relative  . Hypertension Other     parent, other relative  . Stroke Other     parent, other relative  . Cancer Mother     peritoneal/?ovarian cells  . Ovarian cancer Mother     ?  Marland Kitchen Hypertension Mother   . Heart attack Father   . Hypertension Father   . Hyperlipidemia Father   .  Seizures Father     epilepsy  . Diabetes Sister   . Colon cancer Maternal Grandmother     Social History   Social History  . Marital Status: Married    Spouse Name: N/A  . Number of Children: 3  . Years of Education: N/A   Occupational History  . Admin Assitant    Social History Main Topics  . Smoking status: Never Smoker   . Smokeless tobacco: Never Used     Comment: Married, lives at home with spouse & 3 dtrs. Employed as admin asst @ [employer]  . Alcohol Use: 0.6 oz/week    1 Standard drinks or equivalent per week  . Drug Use: No  . Sexual Activity:    Partners: Male    Birth Control/ Protection: Surgical     Comment: Tubal/TAH/BSO   Other Topics Concern  . Not on file    Social History Narrative    ROS:  Pertinent items are noted in HPI.  PHYSICAL EXAMINATION:    BP 126/82 mmHg  Pulse 76  Temp(Src) 98.3 F (36.8 C) (Oral)  Resp 14  Ht 5' 6.5" (1.689 m)  Wt 174 lb (78.926 kg)  BMI 27.67 kg/m2  LMP 06/06/2013    General appearance: alert, cooperative and appears stated age Lungs - CTA bilaterally.  Cor - S1S2 RRR.  No murmur. Abdomen - soft, nontender, no masses. Pelvic: External genitalia:   Bilateral labia majora with erythema inferiorly.              Urethra:  normal appearing urethra with no masses, tenderness or lesions              Bartholins and Skenes: normal                 Vagina: normal appearing vagina with normal color and discharge, no lesions.  White discharge.              Cervix: absent              Bimanual Exam:  Uterus:  uterus absent              Adnexa: normal adnexa and no mass, fullness, tenderness  Tender to palpation over the bladder.                Chaperone was present for exam.  ASSESSMENT  Dysuria. I suspect a UTI. Vaginal burning.   PLAN  Counseled regarding UTI. Urine micro and culture and Affirm sent.  Rx for Bactrim DS po bid for 7 days.  Diflucan 150 mg po.  #2, RF - none.  Hydrate well.  Follow up prn.   An After Visit Summary was printed and given to the patient.  ___15___ minutes face to face time of which over 50% was spent in counseling.

## 2015-06-29 LAB — URINALYSIS, MICROSCOPIC ONLY
BACTERIA UA: NONE SEEN [HPF]
Casts: NONE SEEN [LPF]
RBC / HPF: NONE SEEN RBC/HPF (ref <=2)
WBC UA: NONE SEEN WBC/HPF (ref <=5)
Yeast: NONE SEEN [HPF]

## 2015-06-29 LAB — WET PREP BY MOLECULAR PROBE
Candida species: POSITIVE — AB
GARDNERELLA VAGINALIS: NEGATIVE
Trichomonas vaginosis: NEGATIVE

## 2015-06-30 LAB — URINE CULTURE
Colony Count: NO GROWTH
Organism ID, Bacteria: NO GROWTH

## 2016-01-19 ENCOUNTER — Institutional Professional Consult (permissible substitution): Payer: 59 | Admitting: Pulmonary Disease

## 2016-01-20 ENCOUNTER — Encounter: Payer: Self-pay | Admitting: Pulmonary Disease

## 2016-01-20 ENCOUNTER — Ambulatory Visit (INDEPENDENT_AMBULATORY_CARE_PROVIDER_SITE_OTHER): Payer: 59 | Admitting: Pulmonary Disease

## 2016-01-20 DIAGNOSIS — R0683 Snoring: Secondary | ICD-10-CM | POA: Diagnosis not present

## 2016-01-20 DIAGNOSIS — R05 Cough: Secondary | ICD-10-CM | POA: Diagnosis not present

## 2016-01-20 DIAGNOSIS — R0609 Other forms of dyspnea: Secondary | ICD-10-CM

## 2016-01-20 DIAGNOSIS — R0600 Dyspnea, unspecified: Secondary | ICD-10-CM | POA: Insufficient documentation

## 2016-01-20 DIAGNOSIS — R059 Cough, unspecified: Secondary | ICD-10-CM | POA: Insufficient documentation

## 2016-01-20 MED ORDER — OMEPRAZOLE 40 MG PO CPDR
40.0000 mg | DELAYED_RELEASE_CAPSULE | Freq: Every day | ORAL | 3 refills | Status: DC
Start: 2016-01-20 — End: 2023-10-18

## 2016-01-20 NOTE — Assessment & Plan Note (Addendum)
Patient is being seen for cough. Cough started during [facility] and has persisted. Initially, cough was associated with upper respiratory tract infection. She had chest x-ray in June/2017 which showed infiltrate in the left perihilar zone. She has been given  2 rounds of antibiotics already. She had a repeat chest x-ray in July which showed resolution of infiltrate. Chest x-ray with possible COPD changes.  She also had sinus issues and congestion which has since gotten better with prednisone.  Cough is persistent, dry. Usually worse at night when laying down.   We extensively discussed the diagnosis of cough and possible triggers for cough.  Cough is most likely multifactorial. Likely related to the following:  A. Upper Airway Cough Syndrome. This has gotten better. She took prednisone for a week and denies any sinus congestion and postnasal drip now. We will hold off on medicines with regards to upper airway cough syndrome.  B. GERD  Plan to start PPI 1 tab at HS.  Advised on dietary changes.  Try to keep HOB 30 degrees elevated.  Patient was advised to call if the cough is not better in a week or so on PPI. At that point, we may opt to try her on allergy medicines and medicines for her UACS.   No recent change in environment. No new medicines. No pets. Nothing in  history which would trigger cough.

## 2016-01-20 NOTE — Assessment & Plan Note (Signed)
Patient is a nonsmoker, not been diagnosed with asthma or COPD. She was exposed to her father who was a heavy smoker. Recent chest x-rays in June and July 2017 showed hyperinflated lungs. Recent exertional dyspnea related to the cough. I'm not sure if she has underlying COPD. Plan is to observe how her breathing is with improvement in the cough. If her exertional dyspnea persists for the next month or so, plan for PFT. Holding off on meds for now.

## 2016-01-20 NOTE — Assessment & Plan Note (Signed)
Patient has snoring, unrefreshing sleep the last 1-2 months. This is related to her frequent awakenings because of cough. She has crowded airway.  Plan to observe if her sleep gets better once the cough is better. If the hypersomnia is persistent in the next month or so, she may end up needing a sleep study.

## 2016-01-20 NOTE — Patient Instructions (Signed)
It was a pleasure taking care of you today!  Your cough is most likely multifactorial. Likely related to the following: A. Upper Airway Cough Syndrome > we will hold off on meds for this issue for now.  B. GERD  Start Prilosec 40 mg/tab, 1 tab at HS.  Advised on dietary changes. Avoid food that will make your reflux worse.   Try to keep head of bed 30 degrees elevated.  Wait 2 hours at least after your last meal before you lie down  We will try this plan and see if your cough gets better. Please call the office if your cough worsens while on treatment.   Return to clinic in 6 weeks with Dr. Corrie Dandy or NP

## 2016-01-20 NOTE — Progress Notes (Signed)
Subjective:    Patient ID: Linda Castro, female    DOB: 10-08-1970, 45 y.o.   MRN: [id number]  HPI   This is the case of Linda Castro, 45 y.o. Female, who was referred by Dr. Trilby Castro and Linda Castro  in consultation regarding cough   As you very well know, patient is a non smoker, not been diagnosed with asthma or COPD.  Cough started during [facility] weekend. Dry cough. (-) fevers and chills initially. (-) other symptoms. Took OTC cough meds.  She ended up having fevers and chills 2 weeks after with worse cough.  She was given Abx for 1 week. CXR showed PNA in [place].  She ended up being on zpak and prednisone x 7days.  Rpt CXR in July was normal.  CXR images in June and July were personally reviewed.   She had nasal congestion in Aug 2017 making cough worse. She got another round of Abx.   Sinuses are better.  Denies GERD sx.   Pt has been snoring x 2-3 yrs. (-) witnessed apneas. Sleeps 7 hrs - 8 hrs/night. Has unrefreshed sleep x 3 mos.  Has cough in sleep. Occasional napping in weekends.     Review of Systems  Constitutional: Negative.  Negative for fever and unexpected weight change.  HENT: Negative.  Negative for congestion, dental problem, ear pain, nosebleeds, postnasal drip, rhinorrhea, sinus pressure, sneezing, sore throat and trouble swallowing.   Eyes: Negative.  Negative for redness and itching.  Respiratory: Positive for cough and shortness of breath. Negative for chest tightness and wheezing.   Cardiovascular: Negative.  Negative for palpitations and leg swelling.  Gastrointestinal: Negative.  Negative for nausea and vomiting.  Endocrine: Negative.   Genitourinary: Negative.  Negative for dysuria.  Musculoskeletal: Negative.  Negative for joint swelling.  Skin: Negative.  Negative for rash.  Allergic/Immunologic: Negative.   Neurological: Negative.  Negative for headaches.  Hematological: Negative.  Does not bruise/bleed easily.    Psychiatric/Behavioral: Negative.  Negative for dysphoric mood. The patient is not nervous/anxious.    Past Medical History:  Diagnosis Date  . ANEMIA, IRON DEFICIENCY 09/08/2009  . BREAST CYST, RIGHT 03/10/2010  . Dysmenorrhea   . Dyspareunia   . FATIGUE 09/08/2009  . Fibroadenoma of right breast   . Fibroid   . HYPERTENSION 09/08/2009   history, no longer on medications  . 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 SEIZURE DISORDER 09/08/2009   childhood history, family history of seizures  . SYNCOPE 01/16/2010  . Urinary incontinence    (-) CA, DVT (-) DM  Family History  Problem Relation Age of Onset  . Colon cancer Other     grandparent  . Diabetes Other     parent, grandparent, other relative  . Hypertension Other     parent, other relative  . Stroke Other     parent, other relative  . Cancer Mother     peritoneal/?ovarian cells  . Ovarian cancer Mother     ?  Marland Kitchen Hypertension Mother   . Heart attack Father   . Hypertension Father   . Hyperlipidemia Father   . Seizures Father     epilepsy  . Diabetes Sister   . Colon cancer Maternal Grandmother      Past Surgical History:  Procedure Laterality Date  . ABDOMINAL HYSTERECTOMY Bilateral 07/01/2013   Procedure: TOTAL ABDOMINAL HYSTERECTOMY WITH BILATERAL SALPINGO OOPHORECTOMY;  Surgeon: Jamey Reas de Berton Lan, MD;  Location: [facility];  Service: Gynecology;  Laterality: Bilateral;  . APPENDECTOMY  2006  . BREAST SURGERY  2011   right--fibroadenoma removed  . [place]   x2  . TUBAL LIGATION  1996    Social History   Social History  . Marital status: Married    Spouse name: N/A  . Number of children: 3  . Years of education: N/A   Occupational History  . Admin Assitant    Social History Main Topics  . Smoking status: Never Smoker  . Smokeless tobacco: Never Used     Comment: Married, lives at home with spouse & 3 dtrs. Employed as admin asst @ [employer]  . Alcohol use 0.6  oz/week    1 Standard drinks or equivalent per week  . Drug use: No  . Sexual activity: Yes    Partners: Male    Birth control/ protection: Surgical     Comment: Tubal/TAH/BSO   Other Topics Concern  . Not on file   Social History Narrative  . No narrative on file   Lives in [place].   Allergies  Allergen Reactions  . Hydrocodone Nausea And Vomiting  . Oxycodone Nausea And Vomiting  . Lisinopril-Hydrochlorothiazide Hives and Rash    Tingling sensation on face//kg     Outpatient Medications Prior to Visit  Medication Sig Dispense Refill  . hydrochlorothiazide (HYDRODIURIL) 25 MG tablet Take 1 tablet (25 mg total) by mouth daily. 30 tablet 0  . losartan (COZAAR) 50 MG tablet Take 50 mg by mouth daily.  2  . Multiple Vitamins-Iron (MULTIVITAMINS WITH IRON) TABS tablet Take 1 tablet by mouth daily.    Marland Kitchen estradiol (ESTRACE) 1 MG tablet Take 1 tablet (1 mg total) by mouth daily. 30 tablet 11  . fluconazole (DIFLUCAN) 150 MG tablet Take 1 tablet (150 mg total) by mouth once. Take one tablet.  Repeat in 48 hours if symptoms are not completely resolved. 2 tablet 0  . sulfamethoxazole-trimethoprim (BACTRIM DS) 800-160 MG tablet Take 1 tablet by mouth 2 (two) times daily. Take for 7 days. 14 tablet 0  . traMADol (ULTRAM) 50 MG tablet Take 50 mg by mouth daily as needed.     No facility-administered medications prior to visit.    Meds ordered this encounter  Medications  . fluticasone furoate-vilanterol (BREO ELLIPTA) 100-25 MCG/INH AEPB    Sig: Inhale 1 puff into the lungs daily.  Marland Kitchen omeprazole (PRILOSEC) 40 MG capsule    Sig: Take 1 capsule (40 mg total) by mouth daily.    Dispense:  30 capsule    Refill:  3         Objective:   Physical Exam  Vitals:  Vitals:   01/20/16 1115  BP: 132/76  Pulse: 75  SpO2: 97%  Weight: 176 lb (79.8 kg)  Height: 5' 6.5" (1.689 m)    Constitutional/General:  Pleasant, well-nourished, well-developed, not in any distress,  Comfortably  seating.  Well kempt  Body mass index is 27.98 kg/m. Wt Readings from Last 3 Encounters:  01/20/16 176 lb (79.8 kg)  06/28/15 174 lb (78.9 kg)  06/23/14 167 lb 12.8 oz (76.1 kg)    HEENT: Pupils equal and reactive to light and accommodation. Anicteric sclerae. Normal nasal mucosa.   No oral  lesions,  mouth clear,  oropharynx clear, no postnasal drip. (-) Oral thrush. No dental caries.  Airway - Mallampati class III-IV  Neck: No masses. Midline trachea. No JVD, (-) LAD. (-) bruits appreciated.  Respiratory/Chest: Grossly normal chest. (-)  deformity. (-) Accessory muscle use.  Symmetric expansion. (-) Tenderness on palpation.  Resonant on percussion.  Diminished BS on both lower lung zones. (-) wheezing, crackles, rhonchi (-) egophony  Cardiovascular: Regular rate and  rhythm, heart sounds normal, no murmur or gallops, no peripheral edema  Gastrointestinal:  Normal bowel sounds. Soft, non-tender. No hepatosplenomegaly.  (-) masses.   Musculoskeletal:  Normal muscle tone. Normal gait.   Extremities: Grossly normal. (-) clubbing, cyanosis.  (-) edema  Skin: (-) rash,lesions seen.   Neurological/Psychiatric : alert, oriented to time, place, person. Normal mood and affect          Assessment & Plan:  Cough Patient is being seen for cough. Cough started during [facility] and has persisted. Initially, cough was associated with upper respiratory tract infection. She had chest x-ray in June/2017 which showed infiltrate in the left perihilar zone. She has been given  2 rounds of antibiotics already. She had a repeat chest x-ray in July which showed resolution of infiltrate. Chest x-ray with possible COPD changes.  She also had sinus issues and congestion which has since gotten better with prednisone.  Cough is persistent, dry. Usually worse at night when laying down.   We extensively discussed the diagnosis of cough and possible triggers for cough.  Cough is most  likely multifactorial. Likely related to the following:  A. Upper Airway Cough Syndrome. This has gotten better. She took prednisone for a week and denies any sinus congestion and postnasal drip now. We will hold off on medicines with regards to upper airway cough syndrome.  B. GERD  Plan to start PPI 1 tab at HS.  Advised on dietary changes.  Try to keep HOB 30 degrees elevated.  Patient was advised to call if the cough is not better in a week or so on PPI. At that point, we may opt to try her on allergy medicines and medicines for her UACS.   No recent change in environment. No new medicines. No pets. Nothing in  history which would trigger cough.  Exertional dyspnea Patient is a nonsmoker, not been diagnosed with asthma or COPD. She was exposed to her father who was a heavy smoker. Recent chest x-rays in June and July 2017 showed hyperinflated lungs. Recent exertional dyspnea related to the cough. I'm not sure if she has underlying COPD. Plan is to observe how her breathing is with improvement in the cough. If her exertional dyspnea persists for the next month or so, plan for PFT. Holding off on meds for now.  Snoring Patient has snoring, unrefreshing sleep the last 1-2 months. This is related to her frequent awakenings because of cough. She has crowded airway.  Plan to observe if her sleep gets better once the cough is better. If the hypersomnia is persistent in the next month or so, she may end up needing a sleep study.     Thank you very much for letting me participate in this patient's care. Please do not hesitate to give me a call if you have any questions or concerns regarding the treatment plan.   Patient will follow up with me in 6 weeks.     Monica Becton, MD 01/20/2016   1:04 PM [facility] Pager: [phone] Office: [phone], Fax: [phone]

## 2016-02-07 ENCOUNTER — Encounter: Payer: Self-pay | Admitting: Pulmonary Disease

## 2016-02-08 NOTE — Telephone Encounter (Signed)
AD please advise. Thanks. 

## 2016-02-08 NOTE — Telephone Encounter (Signed)
Per AD >>  Can we try pt on Flonase 2 squirts per nostril at HS?   Cont PPI at bedtime.   Can we try Cetirizine 10 mg daily?   She has sinus congestion before and it may still be driving her cough.   She got better with prednisone before > can we also try her on prednisone 20mg /d for 1 week followed by 10 mg/day for 1 week?   If she is not better with these meds in 2 weeks or so, pls tell her to call back. At that point, we may need to see her sooner. Thanks.  BTW, I tried calling pt but it was VM.    Gerrit Friends

## 2016-02-15 MED ORDER — PREDNISONE 10 MG PO TABS
ORAL_TABLET | ORAL | 0 refills | Status: DC
Start: 2016-02-15 — End: 2017-10-17

## 2016-02-15 NOTE — Telephone Encounter (Signed)
Pt called stating the pharmacy: [facility] on [address]pt can be reached at [phone].Linda Castro

## 2016-02-15 NOTE — Telephone Encounter (Signed)
Spoke with patient via phone call; she is aware of Flonase OTC and Zyrtec OTC as well as Prednisone Rx sent to [place]. Pt is aware to call the office if not feeling any better in 2 weeks. Nothing more needed at this time.

## 2016-02-28 ENCOUNTER — Encounter: Payer: Self-pay | Admitting: Pulmonary Disease

## 2016-03-10 ENCOUNTER — Ambulatory Visit: Payer: 59 | Admitting: Pulmonary Disease

## 2016-05-02 ENCOUNTER — Ambulatory Visit (INDEPENDENT_AMBULATORY_CARE_PROVIDER_SITE_OTHER): Payer: 59 | Admitting: Pulmonary Disease

## 2016-05-02 ENCOUNTER — Encounter: Payer: Self-pay | Admitting: Pulmonary Disease

## 2016-05-02 ENCOUNTER — Other Ambulatory Visit (INDEPENDENT_AMBULATORY_CARE_PROVIDER_SITE_OTHER): Payer: 59

## 2016-05-02 ENCOUNTER — Other Ambulatory Visit: Payer: Self-pay | Admitting: Pulmonary Disease

## 2016-05-02 VITALS — BP 124/94 | HR 78 | Ht 66.5 in | Wt 175.6 lb

## 2016-05-02 DIAGNOSIS — R0683 Snoring: Secondary | ICD-10-CM

## 2016-05-02 DIAGNOSIS — R05 Cough: Secondary | ICD-10-CM

## 2016-05-02 DIAGNOSIS — R059 Cough, unspecified: Secondary | ICD-10-CM

## 2016-05-02 LAB — CBC WITH DIFFERENTIAL/PLATELET
BASOS PCT: 0.6 % (ref 0.0–3.0)
Basophils Absolute: 0 10*3/uL (ref 0.0–0.1)
EOS PCT: 0.9 % (ref 0.0–5.0)
Eosinophils Absolute: 0.1 10*3/uL (ref 0.0–0.7)
HEMATOCRIT: 37.5 % (ref 36.0–46.0)
HEMOGLOBIN: 12.6 g/dL (ref 12.0–15.0)
LYMPHS PCT: 47.6 % — AB (ref 12.0–46.0)
Lymphs Abs: 3.1 10*3/uL (ref 0.7–4.0)
MCHC: 33.5 g/dL (ref 30.0–36.0)
MCV: 80.7 fl (ref 78.0–100.0)
MONO ABS: 0.4 10*3/uL (ref 0.1–1.0)
Monocytes Relative: 6.3 % (ref 3.0–12.0)
Neutro Abs: 2.9 10*3/uL (ref 1.4–7.7)
Neutrophils Relative %: 44.6 % (ref 43.0–77.0)
Platelets: 219 10*3/uL (ref 150.0–400.0)
RBC: 4.65 Mil/uL (ref 3.87–5.11)
RDW: 14.4 % (ref 11.5–15.5)
WBC: 6.6 10*3/uL (ref 4.0–10.5)

## 2016-05-02 NOTE — Progress Notes (Signed)
Subjective:    Patient ID: [name], female    DOB: 1970-08-12, 46 y.o.   MRN: [id number]  HPI   This is the case of Linda Castro, 46 y.o. Female, who was referred by Dr. Trilby Drummer and Delmar Landau  in consultation regarding cough   As you very well know, patient is a non smoker, not been diagnosed with asthma or COPD.  Cough started during [facility] weekend. Dry cough. (-) fevers and chills initially. (-) other symptoms. Took OTC cough meds.  She ended up having fevers and chills 2 weeks after with worse cough.  She was given Abx for 1 week. CXR showed PNA in L base.  She ended up being on zpak and prednisone x 7days.  Rpt CXR in July was normal.  CXR images in June and July were personally reviewed.   She had nasal congestion in Aug 2017 making cough worse. She got another round of Abx.   Sinuses are better.  Denies GERD sx.   Pt has been snoring x 2-3 yrs. (-) witnessed apneas. Sleeps 7 hrs - 8 hrs/night. Has unrefreshed sleep x 3 mos.  Has cough in sleep. Occasional napping in weekends.   ROV 05/02/2016  Pt returns to the office as f/u on her cough.  Since last seen, her cough did not get better with PPI. Was prescribed prenisone for 3 weeks and cough got better.  During Thanksgiving, cough got worse. She was also having hip issues. She was placed on prednisone for 2 weeks and the cough  got better. Cough comes and goes. She denies env allergies. Denies smoking. Works as an Estate agent. Albuterol and breo did NOT help before. (-) cough at HS. Denies sinus issues.     Review of Systems  Constitutional: Negative.  Negative for fever and unexpected weight change.  HENT: Negative.  Negative for congestion, dental problem, ear pain, nosebleeds, postnasal drip, rhinorrhea, sinus pressure, sneezing, sore throat and trouble swallowing.   Eyes: Negative.  Negative for redness and itching.  Respiratory: Positive for shortness of breath. Negative for cough, chest tightness and wheezing.     Cardiovascular: Negative.  Negative for palpitations and leg swelling.  Gastrointestinal: Negative.  Negative for nausea and vomiting.  Endocrine: Negative.   Genitourinary: Negative.  Negative for dysuria.  Musculoskeletal: Negative.  Negative for joint swelling.  Skin: Negative.  Negative for rash.  Allergic/Immunologic: Negative.   Neurological: Negative.  Negative for headaches.  Hematological: Negative.  Does not bruise/bleed easily.  Psychiatric/Behavioral: Negative.  Negative for dysphoric mood. The patient is not nervous/anxious.        Objective:   Physical Exam  Vitals:  Vitals:   05/02/16 1520  BP: (!) 124/94  Pulse: 78  SpO2: 99%  Weight: 175 lb 9.6 oz (79.7 kg)  Height: 5' 6.5" (1.689 m)    Constitutional/General:  Pleasant, well-nourished, well-developed, not in any distress,  Comfortably seating.  Well kempt  Body mass index is 27.92 kg/m. Wt Readings from Last 3 Encounters:  05/02/16 175 lb 9.6 oz (79.7 kg)  01/20/16 176 lb (79.8 kg)  06/28/15 174 lb (78.9 kg)    HEENT: Pupils equal and reactive to light and accommodation. Anicteric sclerae. Normal nasal mucosa.   No oral  lesions,  mouth clear,  oropharynx clear, no postnasal drip. (-) Oral thrush. No dental caries.  Airway - Mallampati class III-IV  Neck: No masses. Midline trachea. No JVD, (-) LAD. (-) bruits appreciated.  Respiratory/Chest: Grossly normal chest. (-)  deformity. (-) Accessory muscle use.  Symmetric expansion. (-) Tenderness on palpation.  Resonant on percussion.  Diminished BS on both lower lung zones. (-) wheezing, crackles, rhonchi (-) egophony  Cardiovascular: Regular rate and  rhythm, heart sounds normal, no murmur or gallops, no peripheral edema  Gastrointestinal:  Normal bowel sounds. Soft, non-tender. No hepatosplenomegaly.  (-) masses.   Musculoskeletal:  Normal muscle tone. Normal gait.   Extremities: Grossly normal. (-) clubbing, cyanosis.  (-) edema  Skin:  (-) rash,lesions seen.   Neurological/Psychiatric : alert, oriented to time, place, person. Normal mood and affect          Assessment & Plan:  Cough Patient is being seen for cough. Cough started during [facility] Day 2017 and has persisted. Initially, cough was associated with upper respiratory tract infection. She had chest x-ray in June/2017 which showed infiltrate in the left perihilar zone. She has been given  2 rounds of antibiotics already. She had a repeat chest x-ray in July which showed resolution of infiltrate. Chest x-ray with possible COPD changes.  We treated her with PPI back in 12/2015 and cough did not get better.  She got prednisone and cough went away.  She had recurrence of cough around Thanksgiving 2017.  She was also having hip issues and so she was placed on prednisone.  Cough got better.   Currently, no cough. She denies UACS or GERD sx.  Her cough typically gets better with prednisone. Denies env changes or triggers. No new meds.   Plan : 1. CBC, IgE, allergy profile.  2.  Hold off on meds for now.  3.  Told pt to give [place] a call when cough recurs.  At that time, will do CXR and blood work. Possible  Eosinophilic PNA which gets better with prednisone. Not sure.   Snoring Pt has snoring but has gotten better. Denies hypersomnia.  Will observe.  May need a PSG,        Patient will follow up with me as needed.     Monica Becton, MD 05/03/2016   5:04 AM [facility] [place] Pager: [phone] Office: [phone], Fax: [phone]

## 2016-05-02 NOTE — Patient Instructions (Signed)
It was a pleasure taking care of you today!  Please call the office once her cough recurs. At that point, we will get a chest x-ray as well as  lab work.  We will do blood work today as well to check for allergies.  Return to clinic in as needed.

## 2016-05-03 LAB — RESPIRATORY ALLERGY PROFILE REGION II ~~LOC~~
Allergen, Cedar tree, t12: <0.1 kU/L
Allergen, Cottonwood, t14: <0.1 kU/L
Allergen, D pternoyssinus,d7: <0.1 kU/L
Allergen, Mouse Urine Protein, e78: <0.1 kU/L
Allergen, Oak,t7: <0.1 kU/L
Aspergillus fumigatus, m3: <0.1 kU/L
Bermuda Grass: <0.1 kU/L
Cat Dander: <0.1 kU/L
Common Ragweed: <0.1 kU/L
Dog Dander: <0.1 kU/L
IGE (IMMUNOGLOBULIN E), SERUM: 4 kU/L (ref <115)
Pecan/Hickory Tree IgE: <0.1 kU/L
Timothy Grass: <0.1 kU/L

## 2016-05-03 NOTE — Assessment & Plan Note (Signed)
Pt has snoring but has gotten better. Denies hypersomnia.  Will observe.  May need a PSG,

## 2016-05-03 NOTE — Assessment & Plan Note (Signed)
Patient is being seen for cough. Cough started during [facility] Day 2017 and has persisted. Initially, cough was associated with upper respiratory tract infection. She had chest x-ray in June/2017 which showed infiltrate in the left perihilar zone. She has been given  2 rounds of antibiotics already. She had a repeat chest x-ray in July which showed resolution of infiltrate. Chest x-ray with possible COPD changes.  We treated her with PPI back in 12/2015 and cough did not get better.  She got prednisone and cough went away.  She had recurrence of cough around Thanksgiving 2017.  She was also having hip issues and so she was placed on prednisone.  Cough got better.   Currently, no cough. She denies UACS or GERD sx.  Her cough typically gets better with prednisone. Denies env changes or triggers. No new meds.   Plan : 1. CBC, IgE, allergy profile.  2.  Hold off on meds for now.  3.  Told pt to give [place] a call when cough recurs.  At that time, will do CXR and blood work. Possible  Eosinophilic PNA which gets better with prednisone. Not sure.

## 2016-06-30 ENCOUNTER — Other Ambulatory Visit: Payer: Self-pay | Admitting: Otolaryngology

## 2016-06-30 DIAGNOSIS — H93A2 Pulsatile tinnitus, left ear: Secondary | ICD-10-CM

## 2016-07-03 ENCOUNTER — Inpatient Hospital Stay
Admission: RE | Admit: 2016-07-03 | Discharge: 2016-07-03 | Disposition: A | Discharge status: Home / Self Care | Payer: 59 | Source: Ambulatory Visit | Attending: Otolaryngology | Admitting: Otolaryngology

## 2016-07-05 ENCOUNTER — Ambulatory Visit
Admission: RE | Admit: 2016-07-05 | Discharge: 2016-07-05 | Disposition: A | Discharge status: Home / Self Care | Payer: 59 | Source: Ambulatory Visit | Attending: Otolaryngology | Admitting: Otolaryngology

## 2016-07-05 DIAGNOSIS — H93A2 Pulsatile tinnitus, left ear: Secondary | ICD-10-CM

## 2016-07-05 MED ORDER — GADOBENATE DIMEGLUMINE 529 MG/ML IV SOLN
15.0000 mL | Freq: Once | INTRAVENOUS | Status: AC | PRN
  Administered 2016-07-05: 15 mL via INTRAVENOUS

## 2016-07-06 ENCOUNTER — Other Ambulatory Visit: Payer: 59

## 2017-10-17 ENCOUNTER — Encounter

## 2017-10-17 ENCOUNTER — Encounter: Payer: Self-pay | Admitting: Neurology

## 2017-10-17 ENCOUNTER — Ambulatory Visit: Payer: BLUE CROSS/BLUE SHIELD | Admitting: Neurology

## 2017-10-17 ENCOUNTER — Other Ambulatory Visit: Payer: Self-pay

## 2017-10-17 VITALS — BP 159/102 | HR 70 | Ht 66.5 in | Wt 169.5 lb

## 2017-10-17 DIAGNOSIS — R202 Paresthesia of skin: Secondary | ICD-10-CM | POA: Diagnosis not present

## 2017-10-17 NOTE — Progress Notes (Signed)
Reason for visit: Headache, paresthesias  Referring physician: Dr. Lisbeth Renshaw is a 47 y.o. female  History of present illness:  Ms. Thammavong is a 47 year old right-handed black female with a history of hypertension who comes in today for an evaluation of episodes of paresthesias affecting the left chin and lower face.  The patient began having episodes around 01 August 2017.  The episode initially lasted about 60 seconds and then cleared, starting in the left lower lip and then spreading to the chin and left face.  The patient had 2 episodes that day and then the next day had a more severe event necessitating a medical evaluation.  The patient underwent MRI of the brain, the disc is brought for my review.  The brain was normal, there was a question of an empty sella, the possibility of pseudotumor cerebri was entertained.  The patient had a MRI of the brain done in October 2018 for pulsatile tinnitus, this study was also normal.  The patient continues to have headaches, she believes that the headaches began in April 2019 as well, the headaches were daily at that time but now are occurring once or twice a week.  The headaches are in the frontal areas and then spread to the left side of the head.  The patient takes Excedrin Migraine with good improvement.  She does not miss work because of the headache.  The headache may last about 2 hours and then improve.  She may have some nausea without vomiting, she denies photophobia or phonophobia with the headache.  The patient still does have an occasional event of left lower face numbness and paresthesias, the last such event occurred 1 day prior to this evaluation.  She does not correlate the episodes of numbness with the headache.  She denies any numbness on the arms or legs or body, she denies any weakness or balance problems or difficulty controlling the bowels or the bladder.  She denies any family history of migraine.  Her father had a  history of seizures, the patient herself had a single seizure event at age 54, she is not on any seizure medications at this time.  She denies any vision changes or changes in speech.  She is sent to this office for an evaluation.  Past Medical History:  Diagnosis Date  . ANEMIA, IRON DEFICIENCY 09/08/2009  . BREAST CYST, RIGHT 03/10/2010  . Dysmenorrhea   . Dyspareunia   . FATIGUE 09/08/2009  . Fibroadenoma of right breast   . Fibroid   . HYPERTENSION 09/08/2009   history, no longer on medications  . 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 SEIZURE DISORDER 09/08/2009   childhood history, family history of seizures  . SYNCOPE 01/16/2010  . Urinary incontinence     Past Surgical History:  Procedure Laterality Date  . ABDOMINAL HYSTERECTOMY Bilateral 07/01/2013   Procedure: TOTAL ABDOMINAL HYSTERECTOMY WITH BILATERAL SALPINGO OOPHORECTOMY;  Surgeon: Jamey Reas de Berton Lan, MD;  Location: [facility];  Service: Gynecology;  Laterality: Bilateral;  . APPENDECTOMY  2006  . BREAST SURGERY  2011   right--fibroadenoma removed  . [place]   x2  . TUBAL LIGATION  1996    Family History  Problem Relation Age of Onset  . Cancer Mother        peritoneal/?ovarian cells  . Ovarian cancer Mother        ?  Marland Kitchen Hypertension Mother   . Heart attack Father   .  Hypertension Father   . Hyperlipidemia Father   . Seizures Father        epilepsy  . Diabetes Sister   . Colon cancer Other        grandparent  . Diabetes Other        parent, grandparent, other relative  . Hypertension Other        parent, other relative  . Stroke Other        parent, other relative  . Colon cancer Maternal Grandmother     Social history:  reports that she has never smoked. She has never used smokeless tobacco. She reports that she drinks about 0.6 oz of alcohol per week. She reports that she does not use drugs.  Medications:  Prior to Admission medications   Medication Sig Start Date End Date  Taking? Authorizing Provider  losartan (COZAAR) 50 MG tablet Take 50 mg by mouth daily. 05/06/15  Yes [provider]  Multiple Vitamins-Iron (MULTIVITAMINS WITH IRON) TABS tablet Take 1 tablet by mouth daily.   Yes [provider]  omeprazole (PRILOSEC) 40 MG capsule Take 1 capsule (40 mg total) by mouth daily. 01/20/16  Yes de [place], [place], MD  PARoxetine (PAXIL) 20 MG tablet Take 20 mg by mouth daily.   Yes [provider]      Allergies  Allergen Reactions  . Hydrocodone Nausea And Vomiting  . Oxycodone Nausea And Vomiting  . Lisinopril-Hydrochlorothiazide Hives and Rash    Tingling sensation on face//kg    ROS:  Out of a complete 14 system review of symptoms, the patient complains only of the following symptoms, and all other reviewed systems are negative.  Fatigue Numbness  Blood pressure (!) 159/102, pulse 70, height 5' 6.5" (1.689 m), weight 169 lb 8 oz (76.9 kg), last menstrual period 06/06/2013.  Physical Exam  General: The patient is alert and cooperative at the time of the examination.  Eyes: Pupils are equal, round, and reactive to light. Discs are flat bilaterally.  Neck: The neck is supple, no carotid bruits are noted.  Respiratory: The respiratory examination is clear.  Cardiovascular: The cardiovascular examination reveals a regular rate and rhythm, no obvious murmurs or rubs are noted.  Skin: Extremities are without significant edema.  Neurologic Exam  Mental status: The patient is alert and oriented x 3 at the time of the examination. The patient has apparent normal recent and remote memory, with an apparently normal attention span and concentration ability.  Cranial nerves: Facial symmetry is present. There is good sensation of the face to pinprick and soft touch bilaterally. The strength of the facial muscles and the muscles to head turning and shoulder shrug are normal bilaterally. Speech is well enunciated, no aphasia  or dysarthria is noted. Extraocular movements are full. Visual fields are full. The tongue is midline, and the patient has symmetric elevation of the soft palate. No obvious hearing deficits are noted.  Motor: The motor testing reveals 5 over 5 strength of all 4 extremities. Good symmetric motor tone is noted throughout.  Sensory: Sensory testing is intact to pinprick, soft touch, vibration sensation, and position sense on all 4 extremities. No evidence of extinction is noted.  Coordination: Cerebellar testing reveals good finger-nose-finger and heel-to-shin bilaterally.  Gait and station: Gait is normal. Tandem gait is normal. Romberg is negative. No drift is seen.  Reflexes: Deep tendon reflexes are symmetric and normal bilaterally. Toes are downgoing bilaterally.   Assessment/Plan:  1.  Episodic headache, possible  migraine  2.  Intermittent left lower face numbness and paresthesias  3.  History of pulsatile tinnitus  The patient has had a recent ophthalmologic evaluation done on 24 September 2017 that did not show evidence of papilledema.  The patient likely does not have pseudotumor cerebri.  The headaches themselves are left-sided in nature, more consistent with migraine.  The patient does not wish to go on a medication for migraine headache currently.  She will be set up for a carotid Doppler study, she will follow-up in 4 months.  Jill Alexanders MD 10/17/2017 11:48 AM  [facility] [address] [place], [place] [address]  Phone [phone] Fax [phone]

## 2017-11-09 ENCOUNTER — Encounter: Payer: Self-pay | Admitting: Oncology

## 2017-11-09 ENCOUNTER — Telehealth: Payer: Self-pay | Admitting: Oncology

## 2017-11-09 NOTE — Telephone Encounter (Signed)
New referral received from Dr. Isidore Moos for protein s deficiency. Pt has been scheduled to see Dr. Alen Blew on 8/8 at 2pm. Pt aware to arrive 30 minutes early. Letter mailed.

## 2017-11-15 ENCOUNTER — Telehealth: Payer: Self-pay | Admitting: Neurology

## 2017-11-15 ENCOUNTER — Ambulatory Visit (HOSPITAL_COMMUNITY)
Admission: RE | Admit: 2017-11-15 | Discharge: 2017-11-15 | Disposition: A | Discharge status: Home / Self Care | Payer: 59 | Source: Ambulatory Visit | Attending: Neurology | Admitting: Neurology

## 2017-11-15 DIAGNOSIS — R202 Paresthesia of skin: Secondary | ICD-10-CM | POA: Diagnosis present

## 2017-11-15 NOTE — Progress Notes (Signed)
Carotid duplex prelim: 1-39% ICA stenosis.  Mantaj Chamberlin Eunice, RDMS, RVT   

## 2017-11-15 NOTE — Telephone Encounter (Signed)
I called the patient.  The carotid Doppler study is normal, the patient will follow-up for her next scheduled visit in the office.  No source of the pulsatile tinnitus was noted.    Carotid doppler 11/15/17:  Final Interpretation: Right Carotid: Velocities in the right ICA are consistent with a 1-39% stenosis.  Left Carotid: Velocities in the left ICA are consistent with a 1-39% stenosis.  Vertebrals: Bilateral vertebral arteries demonstrate antegrade flow. Subclavians: Normal flow hemodynamics were seen in bilateral subclavian       arteries.

## 2017-11-29 ENCOUNTER — Inpatient Hospital Stay: Payer: 59 | Attending: Oncology | Admitting: Oncology

## 2017-11-29 VITALS — BP 140/92 | HR 80 | Temp 98.3°F | Resp 18 | Ht 66.5 in | Wt 176.5 lb

## 2017-11-29 DIAGNOSIS — Z832 Family history of diseases of the blood and blood-forming organs and certain disorders involving the immune mechanism: Secondary | ICD-10-CM | POA: Diagnosis not present

## 2017-11-29 DIAGNOSIS — Z808 Family history of malignant neoplasm of other organs or systems: Secondary | ICD-10-CM | POA: Diagnosis not present

## 2017-11-29 DIAGNOSIS — D6859 Other primary thrombophilia: Secondary | ICD-10-CM | POA: Diagnosis present

## 2017-11-29 DIAGNOSIS — Z90721 Acquired absence of ovaries, unilateral: Secondary | ICD-10-CM

## 2017-11-29 DIAGNOSIS — Z90710 Acquired absence of both cervix and uterus: Secondary | ICD-10-CM | POA: Diagnosis not present

## 2017-11-29 NOTE — Progress Notes (Signed)
Reason for the request: Protein S deficiency  HPI: I was asked by Dr. Shelia Media to evaluate Linda Castro for protein S deficiency.  She is a 47 year old woman with history of hypertension and uterine fibroids who was discovered to have decreased protein S.  In July 2019 she had a protein S level of 67, with free protein S of less than 25.  Her functional protein S level is 28% with the lower limit of normal is 63.  She was seen by Dr. Jannifer Castro for symptoms of paresthesia of the chin and lower face and her evaluation revealed that this episode likely related to migraine carotid Dopplers did not show any abnormalities.  She has family history of protein S deficiency with her mother, grandmother and sister have had blood clots in the past including her sister who is younger than her had pulmonary embolism and currently on Xarelto.  She denies any personal history of any thrombosis episodes including deep vein thrombosis, pulmonary embolism or phlebitis.  She has been pregnant 3 times without any pregnancy complications or postoperative thrombosis.  She also has been on oral contraceptives in the past.  She is status post hysterectomy and oophorectomy for uterine fibroids.  She does have family history of gynecological malignancy and that is why she opted for hysterectomy and oophorectomy.  She does not report any headaches, blurry vision, syncope or seizures. Does not report any fevers, chills or sweats.  Does not report any cough, wheezing or hemoptysis.  Does not report any chest pain, palpitation, orthopnea or leg edema.  Does not report any nausea, vomiting or abdominal pain.  Does not report any constipation or diarrhea.  Does not report any skeletal complaints.    Does not report frequency, urgency or hematuria.  Does not report any skin rashes or lesions. Does not report any heat or cold intolerance.  Does not report any lymphadenopathy or petechiae.  Does not report any anxiety or depression.  Remaining review  of systems is negative.    Past Medical History:  Diagnosis Date  . ANEMIA, IRON DEFICIENCY 09/08/2009  . BREAST CYST, RIGHT 03/10/2010  . Dysmenorrhea   . Dyspareunia   . FATIGUE 09/08/2009  . Fibroadenoma of right breast   . Fibroid   . HYPERTENSION 09/08/2009   history, no longer on medications  . 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 SEIZURE DISORDER 09/08/2009   childhood history, family history of seizures  . SYNCOPE 01/16/2010  . Urinary incontinence   :  Past Surgical History:  Procedure Laterality Date  . ABDOMINAL HYSTERECTOMY Bilateral 07/01/2013   Procedure: TOTAL ABDOMINAL HYSTERECTOMY WITH BILATERAL SALPINGO OOPHORECTOMY;  Surgeon: Jamey Reas de Berton Lan, MD;  Location: [facility];  Service: Gynecology;  Laterality: Bilateral;  . APPENDECTOMY  2006  . BREAST SURGERY  2011   right--fibroadenoma removed  . [place]   x2  . TUBAL LIGATION  1996  :   Current Outpatient Medications:  .  losartan (COZAAR) 50 MG tablet, Take 50 mg by mouth daily., Disp: , Rfl: 2 .  Multiple Vitamins-Iron (MULTIVITAMINS WITH IRON) TABS tablet, Take 1 tablet by mouth daily., Disp: , Rfl:  .  omeprazole (PRILOSEC) 40 MG capsule, Take 1 capsule (40 mg total) by mouth daily., Disp: 30 capsule, Rfl: 3 .  PARoxetine (PAXIL) 20 MG tablet, Take 20 mg by mouth daily., Disp: , Rfl: :  Allergies  Allergen Reactions  . Hydrocodone Nausea And Vomiting  . Oxycodone Nausea And Vomiting  .  Lisinopril-Hydrochlorothiazide Hives and Rash    Tingling sensation on face//kg  :  Family History  Problem Relation Age of Onset  . Cancer Mother        peritoneal/?ovarian cells  . Ovarian cancer Mother        ?  Marland Kitchen Hypertension Mother   . Heart attack Father   . Hypertension Father   . Hyperlipidemia Father   . Seizures Father        epilepsy  . Diabetes Sister   . Colon cancer Other        grandparent  . Diabetes Other        parent, grandparent, other relative  . Hypertension  Other        parent, other relative  . Stroke Other        parent, other relative  . Colon cancer Maternal Grandmother   :  Social History   Socioeconomic History  . Marital status: Married    Spouse name: Not on file  . Number of children: 3  . Years of education: 28  . Highest education level: Not on file  Occupational History  . Occupation: Admin Assitant  Social Needs  . Financial resource strain: Not on file  . Food insecurity:    Worry: Not on file    Inability: Not on file  . Transportation needs:    Medical: Not on file    Non-medical: Not on file  Tobacco Use  . Smoking status: Never Smoker  . Smokeless tobacco: Never Used  . Tobacco comment: Married, lives at home with spouse & 3 dtrs. 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 Alcohol use: Yes    Alcohol/week: 1.0 standard drinks    Types: 1 Standard drinks or equivalent per week    Comment: sometimes  . Drug use: No  . Sexual activity: Yes    Partners: Male    Birth control/protection: Surgical    Comment: Tubal/TAH/BSO  Lifestyle  . Physical activity:    Days per week: Not on file    Minutes per session: Not on file  . Stress: Not on file  Relationships  . Social connections:    Talks on phone: Not on file    Gets together: Not on file    Attends religious service: Not on file    Active member of club or organization: Not on file    Attends meetings of clubs or organizations: Not on file    Relationship status: Not on file  . Intimate partner violence:    Fear of current or ex partner: Not on file    Emotionally abused: Not on file    Physically abused: Not on file    Forced sexual activity: Not on file  Other Topics Concern  . Not on file  Social History Narrative   Lives with family   Caffeine use: 1 cup coffee per day   Right handed   :  Pertinent items are noted in HPI.  Exam: Blood pressure (!) 140/92, pulse 80, temperature 98.3 F (36.8  C), temperature source Oral, resp. rate 18, height 5' 6.5" (1.689 m), weight 176 lb 8 oz (80.1 kg), last menstrual period 06/06/2013, SpO2 100 %.   ECOG 1 General appearance: alert and cooperative appeared without distress. Head: atraumatic without any abnormalities. Eyes: conjunctivae/corneas clear. PERRL.  Sclera anicteric. Throat: lips, mucosa, and tongue normal; without oral thrush or ulcers. Resp: clear to auscultation bilaterally without rhonchi,  wheezes or dullness to percussion. Cardio: regular rate and rhythm, S1, S2 normal, no murmur, click, rub or gallop GI: soft, non-tender; bowel sounds normal; no masses,  no organomegaly Skin: Skin color, texture, turgor normal. No rashes or lesions Lymph nodes: Cervical, supraclavicular, and axillary nodes normal. Neurologic: Grossly normal without any motor, sensory or deep tendon reflexes. Musculoskeletal: No joint deformity or effusion.  CBC    Component Value Date/Time   WBC 6.6 05/02/2016 1601   RBC 4.65 05/02/2016 1601   HGB 12.6 05/02/2016 1601   HGB 12.0 08/08/2013 1319   HCT 37.5 05/02/2016 1601   PLT 219.0 05/02/2016 1601   MCV 80.7 05/02/2016 1601   MCH 27.0 04/26/2015 1334   MCHC 33.5 05/02/2016 1601   RDW 14.4 05/02/2016 1601   LYMPHSABS 3.1 05/02/2016 1601   MONOABS 0.4 05/02/2016 1601   EOSABS 0.1 05/02/2016 1601   BASOSABS 0.0 05/02/2016 1601     Chemistry      Component Value Date/Time   NA 141 04/26/2015 1334   K 3.3 (L) 04/26/2015 1334   CL 104 04/26/2015 1334   CO2 29 04/26/2015 1334   BUN 13 04/26/2015 1334   CREATININE 0.72 04/26/2015 1334   CREATININE 0.69 04/15/2013 0836      Component Value Date/Time   CALCIUM 9.5 04/26/2015 1334   ALKPHOS 26 (L) 04/15/2013 0836   AST 24 04/15/2013 0836   ALT 12 04/15/2013 0836   BILITOT 0.4 04/15/2013 0836       Assessment and Plan:   47 year old woman with:  1.  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  Her total protein S was 67 with free protein S of less than 25%.  Her activity was also reduced to 28%.  She is asymptomatic and has not had any thrombosis but does have strong family history of VTE.  The implication of these findings and the natural course of an inherited and acquired thrombophilia was reviewed.  Her protein as deficiency appears to be inherited rather than acquired.  She does not have any conditions that lowers her protein S levels and no reason to think that she has low vitamin K.  She is not on oral contraceptives and does not have DIC or other conditions that depletes her vitamin K dependent proteins.  From a management standpoint, there is no need for full dose anticoagulation at this time.  She appears to have the laboratory findings of protein S deficiency but clinically has not manifested as such.  She did not have any pregnancy complications or thrombosis postpartum and did not have any thrombosis while she is on oral contraceptives.  I did recommend however avoiding prolonged immobilization and encouraging DVT prophylaxis if she has to have surgery.  Low-dose aspirin can be helpful in preventing arterial blood clots.  I also urged her to have her daughters tested especially if they are desiring pregnancy or being or oral contraceptives.  All her questions were answered to her satisfaction.  2.  Age-appropriate cancer screening: I encouraged her to continue with routine mammography and a colonoscopy.  She is status post hysterectomy and oophorectomy which will decrease her risk of developing GYN malignancy.  3.  Follow-up will be as needed in the future.  30  minutes was spent with the patient face-to-face today.  More than 50% of time was dedicated to patient counseling, education and cussing  the natural course of her laboratory findings and clinical implication for her and her  family.    Thank you for the referral.   A copy of this consult has been forwarded to the requesting physician.

## 2017-11-30 ENCOUNTER — Telehealth: Payer: Self-pay

## 2017-11-30 NOTE — Telephone Encounter (Signed)
Per 8/8 no los 

## 2018-02-18 ENCOUNTER — Ambulatory Visit: Payer: BLUE CROSS/BLUE SHIELD | Admitting: Neurology

## 2018-02-18 ENCOUNTER — Telehealth: Payer: Self-pay | Admitting: Neurology

## 2018-02-18 NOTE — Telephone Encounter (Signed)
This patient did not show for a revisit appointment today. 

## 2018-02-19 ENCOUNTER — Encounter: Payer: Self-pay | Admitting: Neurology

## 2018-10-29 ENCOUNTER — Other Ambulatory Visit: Payer: Self-pay | Admitting: Internal Medicine

## 2018-10-29 DIAGNOSIS — N6310 Unspecified lump in the right breast, unspecified quadrant: Secondary | ICD-10-CM

## 2018-11-04 ENCOUNTER — Ambulatory Visit: Payer: 59

## 2018-11-04 ENCOUNTER — Ambulatory Visit
Admission: RE | Admit: 2018-11-04 | Discharge: 2018-11-04 | Disposition: A | Discharge status: Home / Self Care | Payer: 59 | Source: Ambulatory Visit | Attending: Internal Medicine | Admitting: Internal Medicine

## 2018-11-04 ENCOUNTER — Other Ambulatory Visit: Payer: Self-pay

## 2018-11-04 DIAGNOSIS — N6310 Unspecified lump in the right breast, unspecified quadrant: Secondary | ICD-10-CM

## 2018-11-06 ENCOUNTER — Ambulatory Visit: Payer: 59 | Admitting: Cardiology

## 2018-11-06 ENCOUNTER — Encounter: Payer: Self-pay | Admitting: Cardiology

## 2018-11-06 ENCOUNTER — Other Ambulatory Visit: Payer: Self-pay

## 2018-11-06 VITALS — BP 146/93 | HR 91 | Ht 67.0 in | Wt 186.4 lb

## 2018-11-06 DIAGNOSIS — R002 Palpitations: Secondary | ICD-10-CM

## 2018-11-06 DIAGNOSIS — I10 Essential (primary) hypertension: Secondary | ICD-10-CM | POA: Diagnosis not present

## 2018-11-06 DIAGNOSIS — Z8249 Family history of ischemic heart disease and other diseases of the circulatory system: Secondary | ICD-10-CM

## 2018-11-06 MED ORDER — VERAPAMIL HCL ER 120 MG PO TBCR: 120.0000 mg | EXTENDED_RELEASE_TABLET | Freq: Every day | ORAL | 1 refills | Status: DC

## 2018-11-06 NOTE — Progress Notes (Signed)
Primary Physician:  Merrilee Seashore, MD   Patient ID: Linda Castro, female    DOB: Aug 22, 1970, 48 y.o.   MRN: [id number]  Subjective:    Chief Complaint  Patient presents with   New Patient (Initial Visit)   Palpitations   fam hx of MI    and stroke    HPI: Linda Castro  is a 48 y.o. female  with protein S deficiency, hypertension, hypercholesteremia, family history of early CAD, with recent onset of palpitations and shortness of breath, referred to [place] by Dr. Cleda Clarks, for further evaluation.  In early June while watching TV started having palpitations. She got up and went for ride, but they persisted. She continued to have them throughout the weekend. She did notice that her BP was high. She was placed on monitor, but did not have episodes like she had had before. She was started on Propanolol, but continued to have elevated blood pressure. She is now on amlodipine. Palpitations are not quite as frequent as before, but still occurring.   She has had hypertension for the last 5-10 years, and generally has been well controlled. She is not aware of any hyperlipidemia. No history of diabetes or thyroid disorders.  Past Medical History:  Diagnosis Date   ANEMIA, IRON DEFICIENCY 09/08/2009   BREAST CYST, RIGHT 03/10/2010   Dysmenorrhea    Dyspareunia    FATIGUE 09/08/2009   Fibroadenoma of right breast    Fibroid    HYPERTENSION 09/08/2009   history, no longer on medications   MASTITIS 03/10/2010   SEIZURE DISORDER 09/08/2009   childhood history, family history of seizures   SYNCOPE 01/16/2010   Urinary incontinence     Past Surgical History:  Procedure Laterality Date   ABDOMINAL HYSTERECTOMY Bilateral 07/01/2013   Procedure: TOTAL ABDOMINAL HYSTERECTOMY WITH BILATERAL SALPINGO OOPHORECTOMY;  Surgeon: Jamey Reas de Berton Lan, MD;  Location: [facility];  Service: Gynecology;  Laterality: Bilateral;   APPENDECTOMY  2006   BREAST SURGERY   2011   right--fibroadenoma removed   [place]   x2   TUBAL LIGATION  1996    Social History   Socioeconomic History   Marital status: Married    Spouse name: Not on file   Number of children: 3   Years of education: 12   Highest education level: Not on file  Occupational History   Occupation: Admin Retail banker strain: Not on file   Food insecurity    Worry: Not on file    Inability: Not on file   Transportation needs    Medical: Not on file    Non-medical: Not on file  Tobacco Use   Smoking status: Never Smoker   Smokeless tobacco: Never Used   Tobacco comment: Married, lives at home with spouse & 3 dtrs. 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 Sexual Activity   Alcohol use: Yes    Alcohol/week: 1.0 standard drinks    Types: 1 Standard drinks or equivalent per week    Comment: sometimes   Drug use: No   Sexual activity: Yes    Partners: Male    Birth control/protection: Surgical    Comment: Tubal/TAH/BSO  Lifestyle   Physical activity    Days per week: Not on file    Minutes per session: Not on file   Stress: Not on file  Relationships   Social connections    Talks on  phone: Not on file    Gets together: Not on file    Attends religious service: Not on file    Active member of club or organization: Not on file    Attends meetings of clubs or organizations: Not on file    Relationship status: Not on file   Intimate partner violence    Fear of current or ex partner: Not on file    Emotionally abused: Not on file    Physically abused: Not on file    Forced sexual activity: Not on file  Other Topics Concern   Not on file  Social History Narrative   Lives with family   Caffeine use: 1 cup coffee per day   Right handed     Review of Systems  Constitution: Negative for decreased appetite, malaise/fatigue, weight gain and weight loss.  Eyes: Negative for visual  disturbance.  Cardiovascular: Positive for palpitations. Negative for chest pain, claudication, dyspnea on exertion, leg swelling, orthopnea and syncope.  Respiratory: Negative for hemoptysis and wheezing.   Endocrine: Negative for cold intolerance and heat intolerance.  Hematologic/Lymphatic: Does not bruise/bleed easily.  Skin: Negative for nail changes.  Musculoskeletal: Negative for muscle weakness and myalgias.  Gastrointestinal: Negative for abdominal pain, change in bowel habit, nausea and vomiting.  Neurological: Negative for difficulty with concentration, dizziness, focal weakness and headaches.  Psychiatric/Behavioral: Negative for altered mental status and suicidal ideas.  All other systems reviewed and are negative.     Objective:  Blood pressure (!) 146/93, pulse 91, height _0  (1.702 m), weight 186 lb 6.4 oz (84.6 kg), last menstrual period 06/06/2013, SpO2 98 %. Body mass index is 29.19 kg/m.    Physical Exam  Constitutional: She is oriented to person, place, and time. Vital signs are normal. She appears well-developed and well-nourished.  HENT:  Head: Normocephalic and atraumatic.  Neck: Normal range of motion.  Cardiovascular: Normal rate, regular rhythm, normal heart sounds and intact distal pulses.  Pulses:      Dorsalis pedis pulses are 2+ on the right side and 2+ on the left side.       Posterior tibial pulses are 2+ on the right side and 2+ on the left side.  Pulmonary/Chest: Effort normal and breath sounds normal. No accessory muscle usage. No respiratory distress.  Abdominal: Soft. Bowel sounds are normal.  Musculoskeletal: Normal range of motion.  Neurological: She is alert and oriented to person, place, and time.  Skin: Skin is warm and dry.   Radiology: No results found.  Laboratory examination:   PCP labs 10/07/2018: CBC normal.  Potassium 4.1, creatinine 0.3, EGFR 82/95, CMP normal.  CMP Latest Ref Rng & Units 04/26/2015 07/02/2013 04/15/2013    Glucose 65 - 99 mg/dL 101(H) 113(H) 93  BUN 6 - 20 mg/dL _1 Creatinine 0.44 - 1.00 mg/dL 0.72 0.73 0.69  Sodium 135 - 145 mmol/L 141 138 140  Potassium 3.5 - 5.1 mmol/L 3.3(L) 4.0 3.9  Chloride 101 - 111 mmol/L 104 102 106  CO2 22 - 32 mmol/L _2 Calcium 8.9 - 10.3 mg/dL 9.5 8.5 9.1  Total Protein 6.0 - 8.3 g/dL - - 7.3  Total Bilirubin 0.3 - 1.2 mg/dL - - 0.4  Alkaline Phos 39 - 117 U/L - - 26(L)  AST 0 - 37 U/L - - 24  ALT 0 - 35 U/L - - 12   CBC Latest Ref Rng & Units 05/02/2016 04/26/2015 08/08/2013  WBC 4.0 - 10.5  K/uL 6.6 5.7 -  Hemoglobin 12.0 - 15.0 g/dL 12.6 14.2 12.0  Hematocrit 36.0 - 46.0 % 37.5 44.4 -  Platelets 150.0 - 400.0 K/uL 219.0 204 -   Lipid Panel     Component Value Date/Time   CHOL 168 04/15/2013 0836   TRIG 79 04/15/2013 0836   HDL 61 04/15/2013 0836   CHOLHDL 2.8 04/15/2013 0836   VLDL 16 04/15/2013 0836   LDLCALC 91 04/15/2013 0836   HEMOGLOBIN A1C No results found for: HGBA1C, MPG TSH No results for input(s): TSH in the last 8760 hours.  PRN Meds:. Medications Discontinued During This Encounter  Medication Reason   losartan (COZAAR) 50 MG tablet    PARoxetine (PAXIL) 20 MG tablet    Current Meds  Medication Sig   amLODipine (NORVASC) 2.5 MG tablet Take 2.5 mg by mouth daily.   Multiple Vitamins-Iron (MULTIVITAMINS WITH IRON) TABS tablet Take 1 tablet by mouth daily.   omeprazole (PRILOSEC) 40 MG capsule Take 1 capsule (40 mg total) by mouth daily.   telmisartan (MICARDIS) 40 MG tablet Take 80 mg by mouth daily.    Cardiac Studies:   7 day Ziopatch 10/07/2018: NSR with rare PAC. Patient triggered events for palpitations correlated with sinus rhythm with rare PAC. Symptoms of lightheadedness correlated with sinus tachycardia at 121 bpm. No A fib or SVT noted.   Echo at PCP office 10/17/2018: Normal LVEF.  55-60%.  Normal size.  Mild LVH.  Diastolic dysfunction.  Mild aortic regurgitation.  Trace MR.  Trace TR.  Normal RV  systolic pressure.  Assessment:     ICD-10-CM   1. Palpitations  R00.2 EKG 12-Lead  2. Essential hypertension  I10 Lipid Profile  3. Family history of early CAD  Z82.49     EKG 11/06/2018: Normal sinus rhythm at 80 bpm, left atrial enlargement, normal axis, no evidence of ischemia.   Recommendations:   I patient with hypertension, protein S deficiency, family history of early CAD, referred to [place] for evaluation of palpitations.  I have reviewed recently obtained monitor report from PCP office, had rare PAC and one episode of sinus tachycardia associated with lightheadedness.  She did not tolerate propanolol due to fatigue.  Her blood pressure does continue to be elevated despite addition of amlodipine.  In view of palpitations and uncontrolled hypertension, will discontinue amlodipine and changed to verapamil 120 mg daily.  Continue with telmisartan.  She is walking 2 to 3 miles a few days a week that she tolerates fairly well.  No exertional chest pain or shortness of breath.  I have also reviewed echocardiogram, had mild blood pressure changes with mild LVH and diastolic dysfunction.  Discussed importance of avoiding salt in her diet to help with controlling her blood pressure.  I suspect her palpitations will improve with improved blood pressure control.  I have not ordered stress testing at this time in view of lack of symptoms and uncontrolled hypertension, but may consider for screening due to her risk factors.  She also has not recently had lipid profile testing, will obtain this in the next few days for further re-stratification.  I will see her back in 4 weeks for follow-up on hypertension.  Encouraged her to contact me sooner if needed.   *I have discussed this case with Dr. Einar Gip and he personally examined the patient and participated in formulating the plan.*   Miquel Dunn, MSN, APRN, FNP-C [facility]. [place] Office: [phone] Fax: [phone]

## 2018-12-20 ENCOUNTER — Ambulatory Visit: Payer: 59 | Admitting: Cardiology

## 2019-01-17 LAB — LIPID PANEL
Chol/HDL Ratio: 3.4 ratio (ref 0.0–4.4)
Cholesterol, Total: 214 mg/dL — ABNORMAL HIGH (ref 100–199)
HDL: 63 mg/dL (ref >39)
LDL Chol Calc (NIH): 124 mg/dL — ABNORMAL HIGH (ref 0–99)
Triglycerides: 154 mg/dL — ABNORMAL HIGH (ref 0–149)
VLDL Cholesterol Cal: 27 mg/dL (ref 5–40)

## 2019-01-29 ENCOUNTER — Encounter: Payer: Self-pay | Admitting: Cardiology

## 2019-01-29 ENCOUNTER — Other Ambulatory Visit: Payer: Self-pay

## 2019-01-29 ENCOUNTER — Ambulatory Visit (INDEPENDENT_AMBULATORY_CARE_PROVIDER_SITE_OTHER): Payer: 59 | Admitting: Cardiology

## 2019-01-29 VITALS — BP 145/93 | HR 79 | Temp 97.7°F | Ht 67.0 in | Wt 184.1 lb

## 2019-01-29 DIAGNOSIS — R002 Palpitations: Secondary | ICD-10-CM | POA: Diagnosis not present

## 2019-01-29 DIAGNOSIS — Z8249 Family history of ischemic heart disease and other diseases of the circulatory system: Secondary | ICD-10-CM | POA: Diagnosis not present

## 2019-01-29 DIAGNOSIS — I10 Essential (primary) hypertension: Secondary | ICD-10-CM

## 2019-01-29 MED ORDER — HYDROCHLOROTHIAZIDE 12.5 MG PO CAPS: 12.5000 mg | ORAL_CAPSULE | Freq: Every day | ORAL | 2 refills | Status: DC

## 2019-01-29 MED ORDER — TELMISARTAN 80 MG PO TABS: 80.0000 mg | ORAL_TABLET | Freq: Every day | ORAL | 1 refills | Status: DC

## 2019-01-29 MED ORDER — ROSUVASTATIN CALCIUM 10 MG PO TABS: 10.0000 mg | ORAL_TABLET | Freq: Every day | ORAL | 2 refills | Status: DC

## 2019-01-29 NOTE — Progress Notes (Signed)
Primary Physician:  Merrilee Seashore, MD   Patient ID: Linda Castro, female    DOB: 1970/09/04, 48 y.o.   MRN: [id number]  Subjective:    Chief Complaint  Patient presents with  . Hypertension  . Results    labs  . Follow-up    4wk    HPI: Linda Castro  is a 48 y.o. female  with protein S deficiency, hypertension, hypercholesteremia, family history of early CAD, was symptomatic PACs and brief episodes of sinus tachycardia.  She had been tried on propranolol, but did not tolerate this due to fatigue.  Echocardiogram that was performed in PCP office showed mild blood pressure changes.  Amlodipine was discontinued and she was switched to verapamil.  She now presents for follow-up.  Palpitations have improved since being on Verapamil, palpitations have essentially resolved. Blood pressure has been better controlled with readings in the 130/80-90 range.  She has recently had labs for evaluation of lipids. No history of diabetes or thyroid disorders.  Past Medical History:  Diagnosis Date  . ANEMIA, IRON DEFICIENCY 09/08/2009  . BREAST CYST, RIGHT 03/10/2010  . Dysmenorrhea   . Dyspareunia   . FATIGUE 09/08/2009  . Fibroadenoma of right breast   . Fibroid   . HYPERTENSION 09/08/2009   history, no longer on medications  . 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 SEIZURE DISORDER 09/08/2009   childhood history, family history of seizures  . SYNCOPE 01/16/2010  . Urinary incontinence     Past Surgical History:  Procedure Laterality Date  . ABDOMINAL HYSTERECTOMY Bilateral 07/01/2013   Procedure: TOTAL ABDOMINAL HYSTERECTOMY WITH BILATERAL SALPINGO OOPHORECTOMY;  Surgeon: Jamey Reas de Berton Lan, MD;  Location: [facility];  Service: Gynecology;  Laterality: Bilateral;  . APPENDECTOMY  2006  . BREAST SURGERY  2011   right--fibroadenoma removed  . [place]   x2  . TUBAL LIGATION  1996    Social History   Socioeconomic History  . Marital status:  Married    Spouse name: Not on file  . Number of children: 3  . Years of education: 71  . Highest education level: Not on file  Occupational History  . Occupation: Admin Assitant  Social Needs  . Financial resource strain: Not on file  . Food insecurity    Worry: Not on file    Inability: Not on file  . Transportation needs    Medical: Not on file    Non-medical: Not on file  Tobacco Use  . Smoking status: Never Smoker  . Smokeless tobacco: Never Used  . Tobacco comment: Married, lives at home with spouse & 3 dtrs. 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 Alcohol use: Yes    Alcohol/week: 1.0 standard drinks    Types: 1 Standard drinks or equivalent per week    Comment: sometimes  . Drug use: No  . Sexual activity: Yes    Partners: Male    Birth control/protection: Surgical    Comment: Tubal/TAH/BSO  Lifestyle  . Physical activity    Days per week: Not on file    Minutes per session: Not on file  . Stress: Not on file  Relationships  . Social Herbalist on phone: Not on file    Gets together: Not on file    Attends religious service: Not on file    Active member of club or organization: Not on file    Attends  meetings of clubs or organizations: Not on file    Relationship status: Not on file  . Intimate partner violence    Fear of current or ex partner: Not on file    Emotionally abused: Not on file    Physically abused: Not on file    Forced sexual activity: Not on file  Other Topics Concern  . Not on file  Social History Narrative   Lives with family   Caffeine use: 1 cup coffee per day   Right handed     Review of Systems  Constitution: Negative for decreased appetite, malaise/fatigue, weight gain and weight loss.  Eyes: Negative for visual disturbance.  Cardiovascular: Negative for chest pain, claudication, dyspnea on exertion, leg swelling, orthopnea, palpitations and syncope.  Respiratory: Negative  for hemoptysis and wheezing.   Endocrine: Negative for cold intolerance and heat intolerance.  Hematologic/Lymphatic: Does not bruise/bleed easily.  Skin: Negative for nail changes.  Musculoskeletal: Negative for muscle weakness and myalgias.  Gastrointestinal: Negative for abdominal pain, change in bowel habit, nausea and vomiting.  Neurological: Negative for difficulty with concentration, dizziness, focal weakness and headaches.  Psychiatric/Behavioral: Negative for altered mental status and suicidal ideas.  All other systems reviewed and are negative.     Objective:  Blood pressure (!) 145/93, pulse 79, temperature 97.7 F (36.5 C), height '5\' 7"'  (1.702 m), weight 184 lb 1.6 oz (83.5 kg), last menstrual period 06/06/2013, SpO2 98 %. Body mass index is 28.83 kg/m.    Physical Exam  Constitutional: She is oriented to person, place, and time. Vital signs are normal. She appears well-developed and well-nourished.  HENT:  Head: Normocephalic and atraumatic.  Neck: Normal range of motion.  Cardiovascular: Normal rate, regular rhythm, normal heart sounds and intact distal pulses.  Pulses:      Dorsalis pedis pulses are 2+ on the right side and 2+ on the left side.       Posterior tibial pulses are 2+ on the right side and 2+ on the left side.  Pulmonary/Chest: Effort normal and breath sounds normal. No accessory muscle usage. No respiratory distress.  Abdominal: Soft. Bowel sounds are normal.  Musculoskeletal: Normal range of motion.  Neurological: She is alert and oriented to person, place, and time.  Skin: Skin is warm and dry.   Radiology: No results found.  Laboratory examination:   PCP labs 10/07/2018: CBC normal.  Potassium 4.1, creatinine 0.3, EGFR 82/95, CMP normal.  CMP Latest Ref Rng & Units 04/26/2015 07/02/2013 04/15/2013  Glucose 65 - 99 mg/dL 101(H) 113(H) 93  BUN 6 - 20 mg/dL '13 9 10  ' Creatinine 0.44 - 1.00 mg/dL 0.72 0.73 0.69  Sodium 135 - 145 mmol/L 141 138 140   Potassium 3.5 - 5.1 mmol/L 3.3(L) 4.0 3.9  Chloride 101 - 111 mmol/L 104 102 106  CO2 22 - 32 mmol/L '29 28 25  ' Calcium 8.9 - 10.3 mg/dL 9.5 8.5 9.1  Total Protein 6.0 - 8.3 g/dL - - 7.3  Total Bilirubin 0.3 - 1.2 mg/dL - - 0.4  Alkaline Phos 39 - 117 U/L - - 26(L)  AST 0 - 37 U/L - - 24  ALT 0 - 35 U/L - - 12   CBC Latest Ref Rng & Units 05/02/2016 04/26/2015 08/08/2013  WBC 4.0 - 10.5 K/uL 6.6 5.7 -  Hemoglobin 12.0 - 15.0 g/dL 12.6 14.2 12.0  Hematocrit 36.0 - 46.0 % 37.5 44.4 -  Platelets 150.0 - 400.0 K/uL 219.0 204 -   Lipid Panel  Component Value Date/Time   CHOL 214 (H) 01/16/2019 1129   TRIG 154 (H) 01/16/2019 1129   HDL 63 01/16/2019 1129   CHOLHDL 3.4 01/16/2019 1129   CHOLHDL 2.8 04/15/2013 0836   VLDL 16 04/15/2013 0836   LDLCALC 124 (H) 01/16/2019 1129   HEMOGLOBIN A1C No results found for: HGBA1C, MPG TSH No results for input(s): TSH in the last 8760 hours.  PRN Meds:. There are no discontinued medications. Current Meds  Medication Sig  . Multiple Vitamins-Iron (MULTIVITAMINS WITH IRON) TABS tablet Take 1 tablet by mouth daily.  Marland Kitchen omeprazole (PRILOSEC) 40 MG capsule Take 1 capsule (40 mg total) by mouth daily.  Marland Kitchen telmisartan (MICARDIS) 80 MG tablet Take 80 mg by mouth daily.   . verapamil (CALAN-SR) 120 MG CR tablet Take 1 tablet (120 mg total) by mouth at bedtime.    Cardiac Studies:   7 day Ziopatch 10/07/2018: NSR with rare PAC. Patient triggered events for palpitations correlated with sinus rhythm with rare PAC. Symptoms of lightheadedness correlated with sinus tachycardia at 121 bpm. No A fib or SVT noted.   Echo at PCP office 10/17/2018: Normal LVEF.  55-60%.  Normal size.  Mild LVH.  Diastolic dysfunction.  Mild aortic regurgitation.  Trace MR.  Trace TR.  Normal RV systolic pressure.  Assessment:     ICD-10-CM   1. Palpitations  R00.2   2. Essential hypertension  I10   3. Family history of early CAD  Z82.49     EKG 11/06/2018: Normal sinus  rhythm at 80 bpm, left atrial enlargement, normal axis, no evidence of ischemia.   Recommendations:   Palpitations have essentially resolved sine being on Verapamil. Blood pressure has improved, but continues to be elevated. She has lisinopril HCT listed as a allergy; however, she states her allergy is to lisinopril as she had previously been on HCT alone and tolerated this well. Will add HCT for better blood pressure control and if she tolerates, will combine with her Micardis. Encouraged continued low sodium diet to help with BP control.  I have discussed her recently obtained lipid panel, which is slightly elevated. In view of her family history, I have recommended starting low dose statin therapy. Will start Crestor 10 mg daily. She will need repeat lipid panel in the next few months.  She is walking 2 miles, 6 days per week and tolerating this without any chest pain or shortness of breath. Will plan to see her back in 6 weeks for follow up on hypertension.    Miquel Dunn, MSN, APRN, FNP-C [facility]. [place] Office: [phone] Fax: [phone]

## 2019-03-17 ENCOUNTER — Encounter: Payer: Self-pay | Admitting: Cardiology

## 2019-03-17 ENCOUNTER — Other Ambulatory Visit: Payer: Self-pay

## 2019-03-17 ENCOUNTER — Ambulatory Visit (INDEPENDENT_AMBULATORY_CARE_PROVIDER_SITE_OTHER): Payer: 59 | Admitting: Cardiology

## 2019-03-17 VITALS — BP 149/92 | HR 89 | Ht 67.0 in | Wt 187.3 lb

## 2019-03-17 DIAGNOSIS — R002 Palpitations: Secondary | ICD-10-CM | POA: Diagnosis not present

## 2019-03-17 DIAGNOSIS — I10 Essential (primary) hypertension: Secondary | ICD-10-CM | POA: Diagnosis not present

## 2019-03-17 DIAGNOSIS — E785 Hyperlipidemia, unspecified: Secondary | ICD-10-CM | POA: Diagnosis not present

## 2019-03-17 MED ORDER — VERAPAMIL HCL ER 180 MG PO TBCR: 180.0000 mg | EXTENDED_RELEASE_TABLET | Freq: Every day | ORAL | 1 refills | Status: DC

## 2019-03-17 NOTE — Progress Notes (Signed)
Primary Physician:  Merrilee Seashore, MD   Patient ID: Linda Castro, female    DOB: [birth year], 48 y.o.   MRN: [id number]  Subjective:    Chief Complaint  Patient presents with  . Hypertension  . Follow-up    HPI: Linda Castro  is a 48 y.o. female  with protein S deficiency, hypertension, hypercholesteremia, family history of early CAD, was symptomatic PACs and brief episodes of sinus tachycardia.  At her last office visit, she was started on HCTZ for hypertension and Crestor for hyperlipidemia. She now presents for follow up.   Palpitations have improved since being on Verapamil, but are still occurring. Blood pressure has not changed much with the addition of HCT. She is still having systolic readings in the upper 935 and diastolic in the low 70'V. She is tolerating Crestor well.   No history of diabetes or thyroid disorders.  She continues to walk 2 miles per day, 6 days a week.   Past Medical History:  Diagnosis Date  . ANEMIA, IRON DEFICIENCY 09/08/2009  . BREAST CYST, RIGHT 03/10/2010  . Dysmenorrhea   . Dyspareunia   . FATIGUE 09/08/2009  . Fibroadenoma of right breast   . Fibroid   . HYPERTENSION 09/08/2009   history, no longer on medications  . 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 SEIZURE DISORDER 09/08/2009   childhood history, family history of seizures  . SYNCOPE 01/16/2010  . Urinary incontinence     Past Surgical History:  Procedure Laterality Date  . ABDOMINAL HYSTERECTOMY Bilateral 07/01/2013   Procedure: TOTAL ABDOMINAL HYSTERECTOMY WITH BILATERAL SALPINGO OOPHORECTOMY;  Surgeon: Jamey Reas de Berton Lan, MD;  Location: [facility];  Service: Gynecology;  Laterality: Bilateral;  . APPENDECTOMY  2006  . BREAST SURGERY  2011   right--fibroadenoma removed  . [place]   x2  . TUBAL LIGATION  1996    Social History   Socioeconomic History  . Marital status: Married    Spouse name: Not on file  . Number of children: 3  .  Years of education: 15  . Highest education level: Not on file  Occupational History  . Occupation: Admin Assitant  Social Needs  . Financial resource strain: Not on file  . Food insecurity    Worry: Not on file    Inability: Not on file  . Transportation needs    Medical: Not on file    Non-medical: Not on file  Tobacco Use  . Smoking status: Never Smoker  . Smokeless tobacco: Never Used  . Tobacco comment: Married, lives at home with spouse & 3 dtrs. 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 Alcohol use: Yes    Alcohol/week: 1.0 standard drinks    Types: 1 Standard drinks or equivalent per week    Comment: sometimes  . Drug use: No  . Sexual activity: Yes    Partners: Male    Birth control/protection: Surgical    Comment: Tubal/TAH/BSO  Lifestyle  . Physical activity    Days per week: Not on file    Minutes per session: Not on file  . Stress: Not on file  Relationships  . Social Herbalist on phone: Not on file    Gets together: Not on file    Attends religious service: Not on file    Active member of club or organization: Not on file    Attends meetings of clubs or organizations:  Not on file    Relationship status: Not on file  . Intimate partner violence    Fear of current or ex partner: Not on file    Emotionally abused: Not on file    Physically abused: Not on file    Forced sexual activity: Not on file  Other Topics Concern  . Not on file  Social History Narrative   Lives with family   Caffeine use: 1 cup coffee per day   Right handed     Review of Systems  Constitution: Negative for decreased appetite, malaise/fatigue, weight gain and weight loss.  Eyes: Negative for visual disturbance.  Cardiovascular: Negative for chest pain, claudication, dyspnea on exertion, leg swelling, orthopnea, palpitations and syncope.  Respiratory: Negative for hemoptysis and wheezing.   Endocrine: Negative for cold  intolerance and heat intolerance.  Hematologic/Lymphatic: Does not bruise/bleed easily.  Skin: Negative for nail changes.  Musculoskeletal: Negative for muscle weakness and myalgias.  Gastrointestinal: Negative for abdominal pain, change in bowel habit, nausea and vomiting.  Neurological: Negative for difficulty with concentration, dizziness, focal weakness and headaches.  Psychiatric/Behavioral: Negative for altered mental status and suicidal ideas.  All other systems reviewed and are negative.     Objective:  Blood pressure (!) 149/92, pulse 89, height '5\' 7"'  (1.702 m), weight 187 lb 4.8 oz (85 kg), last menstrual period 06/06/2013, SpO2 99 %. Body mass index is 29.34 kg/m.   Vitals with BMI 03/17/2019 01/29/2019 11/06/2018  Height '5\' 7"'  '5\' 7"'  '5\' 7"'   Weight 187 lbs 5 oz 184 lbs 2 oz 186 lbs 6 oz  BMI 29.33 11.65 79.03  Systolic 833 383 291  Diastolic 92 93 93  Pulse 89 79 91       Physical Exam  Constitutional: She is oriented to person, place, and time. Vital signs are normal. She appears well-developed and well-nourished.  HENT:  Head: Normocephalic and atraumatic.  Neck: Normal range of motion.  Cardiovascular: Normal rate, regular rhythm, normal heart sounds and intact distal pulses.  Pulses:      Dorsalis pedis pulses are 2+ on the right side and 2+ on the left side.       Posterior tibial pulses are 2+ on the right side and 2+ on the left side.  Pulmonary/Chest: Effort normal and breath sounds normal. No accessory muscle usage. No respiratory distress.  Abdominal: Soft. Bowel sounds are normal.  Musculoskeletal: Normal range of motion.  Neurological: She is alert and oriented to person, place, and time.  Skin: Skin is warm and dry.   Radiology: No results found.  Laboratory examination:   PCP labs 10/07/2018: CBC normal.  Potassium 4.1, creatinine 0.3, EGFR 82/95, CMP normal.  CMP Latest Ref Rng & Units 04/26/2015 07/02/2013 04/15/2013  Glucose 65 - 99 mg/dL 101(H)  113(H) 93  BUN 6 - 20 mg/dL '13 9 10  ' Creatinine 0.44 - 1.00 mg/dL 0.72 0.73 0.69  Sodium 135 - 145 mmol/L 141 138 140  Potassium 3.5 - 5.1 mmol/L 3.3(L) 4.0 3.9  Chloride 101 - 111 mmol/L 104 102 106  CO2 22 - 32 mmol/L '29 28 25  ' Calcium 8.9 - 10.3 mg/dL 9.5 8.5 9.1  Total Protein 6.0 - 8.3 g/dL - - 7.3  Total Bilirubin 0.3 - 1.2 mg/dL - - 0.4  Alkaline Phos 39 - 117 U/L - - 26(L)  AST 0 - 37 U/L - - 24  ALT 0 - 35 U/L - - 12   CBC Latest Ref Rng & Units  05/02/2016 04/26/2015 08/08/2013  WBC 4.0 - 10.5 K/uL 6.6 5.7 -  Hemoglobin 12.0 - 15.0 g/dL 12.6 14.2 12.0  Hematocrit 36.0 - 46.0 % 37.5 44.4 -  Platelets 150.0 - 400.0 K/uL 219.0 204 -   Lipid Panel     Component Value Date/Time   CHOL 214 (H) 01/16/2019 1129   TRIG 154 (H) 01/16/2019 1129   HDL 63 01/16/2019 1129   CHOLHDL 3.4 01/16/2019 1129   CHOLHDL 2.8 04/15/2013 0836   VLDL 16 04/15/2013 0836   LDLCALC 124 (H) 01/16/2019 1129   HEMOGLOBIN A1C No results found for: HGBA1C, MPG TSH No results for input(s): TSH in the last 8760 hours.  PRN Meds:. Medications Discontinued During This Encounter  Medication Reason  . verapamil (CALAN-SR) 120 MG CR tablet Discontinued by provider   Current Meds  Medication Sig  . hydrochlorothiazide (MICROZIDE) 12.5 MG capsule Take 1 capsule (12.5 mg total) by mouth daily.  . Multiple Vitamins-Iron (MULTIVITAMINS WITH IRON) TABS tablet Take 1 tablet by mouth daily.  Marland Kitchen omeprazole (PRILOSEC) 40 MG capsule Take 1 capsule (40 mg total) by mouth daily.  . rosuvastatin (CRESTOR) 10 MG tablet Take 1 tablet (10 mg total) by mouth daily.  Marland Kitchen telmisartan (MICARDIS) 80 MG tablet Take 1 tablet (80 mg total) by mouth daily.  . [DISCONTINUED] verapamil (CALAN-SR) 120 MG CR tablet Take 1 tablet (120 mg total) by mouth at bedtime.    Cardiac Studies:   7 day Ziopatch 10/07/2018: NSR with rare PAC. Patient triggered events for palpitations correlated with sinus rhythm with rare PAC. Symptoms of  lightheadedness correlated with sinus tachycardia at 121 bpm. No A fib or SVT noted.   Echo at PCP office 10/17/2018: Normal LVEF.  55-60%.  Normal size.  Mild LVH.  Diastolic dysfunction.  Mild aortic regurgitation.  Trace MR.  Trace TR.  Normal RV systolic pressure.  Assessment:     ICD-10-CM   1. Essential hypertension  I10   2. Palpitations  R00.2   3. Mild hyperlipidemia  E78.5     EKG 11/06/2018: Normal sinus rhythm at 80 bpm, left atrial enlargement, normal axis, no evidence of ischemia.   Recommendations:   She continues to have elevated blood pressure despite addition of hydrochlorothiazide.  I will further increase her verapamil to 180 mg daily.  Hopefully this will also help with her continued palpitations.  Her symptoms of palpitations have improved since she was initially seen by [place], but she does continue to occasionally have them.  She will notify me in 2 weeks if her blood pressure has improved with higher dose of verapamil, and if not we will make further changes at that time.  She was started on Crestor at her last office visit due to mild hyperlipidemia and family history of CAD.  She is tolerating Crestor well.  Will need repeat lipid panel in the next few months for continued surveillance.  Encouraged her to continue with low-sodium diet and regular exercise.  I will see her back in 2 months for follow-up, but encouraged her to contact me sooner if needed.   Miquel Dunn, MSN, APRN, FNP-C [facility]. [place] Office: [phone] Fax: [phone]

## 2019-04-08 ENCOUNTER — Other Ambulatory Visit: Payer: Self-pay | Admitting: Cardiology

## 2019-04-10 NOTE — Telephone Encounter (Signed)
Please read

## 2019-04-30 ENCOUNTER — Other Ambulatory Visit: Payer: Self-pay

## 2019-04-30 MED ORDER — VERAPAMIL HCL ER 180 MG PO TBCR: 180.0000 mg | EXTENDED_RELEASE_TABLET | Freq: Every day | ORAL | 1 refills | Status: DC

## 2019-05-19 ENCOUNTER — Ambulatory Visit: Payer: 59 | Admitting: Cardiology

## 2019-05-25 ENCOUNTER — Other Ambulatory Visit: Payer: Self-pay | Admitting: Cardiology

## 2019-07-03 ENCOUNTER — Ambulatory Visit: Payer: Self-pay | Attending: Family

## 2019-07-03 DIAGNOSIS — Z23 Encounter for immunization: Secondary | ICD-10-CM

## 2019-07-03 NOTE — Progress Notes (Signed)
   Covid-19 Vaccination Clinic  Name:  AIRIANA COLLEDGE    MRN: [id number] DOB: 1970/05/13  07/03/2019  Ms. Collier was observed post Covid-19 immunization for 15 minutes without incident. She was provided with Vaccine Information Sheet and instruction to access the V-Safe system.   Ms. Digirolamo was instructed to call 911 with any severe reactions post vaccine: Marland Kitchen Difficulty breathing  . Swelling of face and throat  . A fast heartbeat  . A bad rash all over body  . Dizziness and weakness   Immunizations Administered    Name Date Dose VIS Date Route   Moderna COVID-19 Vaccine 07/03/2019  4:22 PM 0.5 mL 03/25/2019 Intramuscular   Manufacturer: Moderna   Lot: [id number]   [id number]

## 2019-07-30 ENCOUNTER — Other Ambulatory Visit: Payer: Self-pay | Admitting: Cardiology

## 2019-07-31 ENCOUNTER — Telehealth: Payer: Self-pay

## 2019-07-31 ENCOUNTER — Other Ambulatory Visit: Payer: Self-pay

## 2019-07-31 MED ORDER — TELMISARTAN 80 MG PO TABS: 80.0000 mg | ORAL_TABLET | Freq: Every day | ORAL | 0 refills | Status: DC

## 2019-07-31 NOTE — Telephone Encounter (Signed)
Please call patient about a refill. I've already made her a f/u appointment so she should be good.

## 2019-08-05 ENCOUNTER — Ambulatory Visit: Payer: Self-pay | Attending: Family

## 2019-08-05 DIAGNOSIS — Z23 Encounter for immunization: Secondary | ICD-10-CM

## 2019-08-05 NOTE — Progress Notes (Signed)
   Covid-19 Vaccination Clinic  Name:  Linda Castro    MRN: [id number] DOB: Jul 05, 1970  08/05/2019  [name] was observed post Covid-19 immunization for 15 minutes without incident. She was provided with Vaccine Information Sheet and instruction to access the V-Safe system.   [name] was instructed to call 911 with any severe reactions post vaccine: Marland Kitchen Difficulty breathing  . Swelling of face and throat  . A fast heartbeat  . A bad rash all over body  . Dizziness and weakness   Immunizations Administered    Name Date Dose VIS Date Route   Moderna COVID-19 Vaccine 08/05/2019  4:15 AM 0.5 mL 03/25/2019 Intramuscular   Manufacturer: Moderna   Lot: [id number]   [id number]

## 2019-08-21 ENCOUNTER — Other Ambulatory Visit: Payer: Self-pay

## 2019-08-21 ENCOUNTER — Encounter: Payer: Self-pay | Admitting: Cardiology

## 2019-08-21 ENCOUNTER — Ambulatory Visit: Payer: BC Managed Care – PPO | Admitting: Cardiology

## 2019-08-21 VITALS — BP 142/94 | HR 80 | Temp 98.3°F | Ht 67.0 in | Wt 185.0 lb

## 2019-08-21 DIAGNOSIS — I491 Atrial premature depolarization: Secondary | ICD-10-CM

## 2019-08-21 DIAGNOSIS — Z8249 Family history of ischemic heart disease and other diseases of the circulatory system: Secondary | ICD-10-CM

## 2019-08-21 DIAGNOSIS — R002 Palpitations: Secondary | ICD-10-CM

## 2019-08-21 DIAGNOSIS — I10 Essential (primary) hypertension: Secondary | ICD-10-CM

## 2019-08-21 DIAGNOSIS — E785 Hyperlipidemia, unspecified: Secondary | ICD-10-CM

## 2019-08-21 MED ORDER — VERAPAMIL HCL ER 120 MG PO TBCR: 120.0000 mg | EXTENDED_RELEASE_TABLET | Freq: Every morning | ORAL | 0 refills | Status: DC

## 2019-08-21 MED ORDER — TELMISARTAN 80 MG PO TABS: 80.0000 mg | ORAL_TABLET | Freq: Every evening | ORAL | 1 refills | Status: DC

## 2019-08-21 NOTE — Progress Notes (Signed)
[name] Date of Birth: 07-Sep-1970 MRN: [id number] Primary Care Provider:Ramachandran, Mauro Kaufmann, MD Former Cardiology Providers: Jeri Lager, APRN, FNP-C Primary Cardiologist: Rex Kras, DO, [facility] (established care 08/21/2019)  Date: 08/21/19 Last Office Visit: 03/17/2019  Chief Complaint  Patient presents with  . Hypertension  . Follow-up    medication management    HPI  Linda Castro is a 49 y.o.  female who presents to the office with a chief complaint of " follow-up on blood pressures and hyperlipidemia." Patient's past medical history and cardiovascular risk factors include: Protein S deficiency, hypertension, hyperlipidemia, family history of premature coronary artery disease, symptomatic PACs.  At last office visit patient's verapamil dose was going to be increased to 180 mg p.o. daily to help improve her symptoms of palpitations due to her underlying symptomatic PVCs.  Patient stated that she tolerated the medication well.  She was also started on cholesterol medications given her mild hyperlipidemia and family history of premature coronary artery disease.  However, patient states that she might of had an allergic reaction to the medication as she had a rash and after cessation of the medication it resolved.  Patient was supposed to follow-up with [place] but due to losing his job and insurance this had to be postponed.  Patient presents to the office today to readdress her blood pressures and restart cholesterol medications.  Benign essential hypertension: Patient has been out of her blood pressure medications for some time for reasons mentioned above.  Her systolic blood pressures are between 756-433 mmHg and diastolic blood pressures are between 90-102 mmHg at home.  She checks her blood pressures early in the morning prior to taking her antihypertensive medications.  Patient states that she does snore at night according to her husband as well.  Patient has been off of  verapamil and telmisartan because she had ran out of medications.  ALLERGIES: Allergies  Allergen Reactions  . Hydrocodone Nausea And Vomiting  . Oxycodone Nausea And Vomiting  . Codeine Nausea And Vomiting  . Lisinopril Hives and Rash    Tingling sensation in face.      MEDICATION LIST PRIOR TO VISIT: Current Outpatient Medications on File Prior to Visit  Medication Sig Dispense Refill  . hydrochlorothiazide (MICROZIDE) 12.5 MG capsule Take 1 capsule (12.5 mg total) by mouth daily. 30 capsule 2  . Multiple Vitamins-Iron (MULTIVITAMINS WITH IRON) TABS tablet Take 1 tablet by mouth daily.    Marland Kitchen omeprazole (PRILOSEC) 40 MG capsule Take 1 capsule (40 mg total) by mouth daily. 30 capsule 3   No current facility-administered medications on file prior to visit.    PAST MEDICAL HISTORY: Past Medical History:  Diagnosis Date  . ANEMIA, IRON DEFICIENCY 09/08/2009  . BREAST CYST, RIGHT 03/10/2010  . Dysmenorrhea   . Dyspareunia   . FATIGUE 09/08/2009  . Fibroadenoma of right breast   . Fibroid   . HYPERTENSION 09/08/2009   history, no longer on medications  . 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 SEIZURE DISORDER 09/08/2009   childhood history, family history of seizures  . SYNCOPE 01/16/2010  . Urinary incontinence     PAST SURGICAL HISTORY: Past Surgical History:  Procedure Laterality Date  . ABDOMINAL HYSTERECTOMY Bilateral 07/01/2013   Procedure: TOTAL ABDOMINAL HYSTERECTOMY WITH BILATERAL SALPINGO OOPHORECTOMY;  Surgeon: Jamey Reas de Berton Lan, MD;  Location: [facility];  Service: Gynecology;  Laterality: Bilateral;  . APPENDECTOMY  2006  . BREAST SURGERY  2011   right--fibroadenoma removed  . CESAREAN SECTION  [place]   x2  . TUBAL LIGATION  1996    FAMILY HISTORY: The patient's family history includes Cancer in her mother; Colon cancer in her maternal grandmother and another family member; Diabetes in her sister and another family member; Heart attack in her father;  Hyperlipidemia in her father; Hypertension in her father, mother, and another family member; Ovarian cancer in her mother; Seizures in her father; Stroke in an other family member.   SOCIAL HISTORY:  The patient  reports that she has never smoked. She has never used smokeless tobacco. She reports current alcohol use of about 1.0 standard drinks of alcohol per week. She reports that she does not use drugs.  Review of Systems  Constitution: Negative for chills and fever.  HENT: Negative for hoarse voice and nosebleeds.   Eyes: Negative for discharge, double vision and pain.  Cardiovascular: Negative for chest pain, claudication, dyspnea on exertion, leg swelling, near-syncope, orthopnea, palpitations, paroxysmal nocturnal dyspnea and syncope.  Respiratory: Positive for snoring. Negative for hemoptysis and shortness of breath.   Musculoskeletal: Negative for muscle cramps and myalgias.  Gastrointestinal: Negative for abdominal pain, constipation, diarrhea, hematemesis, hematochezia, melena, nausea and vomiting.  Neurological: Negative for dizziness and light-headedness.    PHYSICAL EXAM: Vitals with BMI 08/21/2019 03/17/2019 01/29/2019  Height '5\' 7"'  '5\' 7"'  '5\' 7"'   Weight 185 lbs 187 lbs 5 oz 184 lbs 2 oz  BMI 28.97 37.90 24.09  Systolic 735 329 924  Diastolic 94 92 93  Pulse 80 89 79    CONSTITUTIONAL: Well-developed and well-nourished. No acute distress.  SKIN: Skin is warm and dry. No rash noted. No cyanosis. No pallor. No jaundice HEAD: Normocephalic and atraumatic.  EYES: No scleral icterus MOUTH/THROAT: Moist oral membranes.  NECK: No JVD present. No thyromegaly noted. No carotid bruits  LYMPHATIC: No visible cervical adenopathy.  CHEST Normal respiratory effort. No intercostal retractions  LUNGS: Clear to auscultation bilaterally.  No stridor. No wheezes. No rales.  CARDIOVASCULAR: Regular rate and rhythm, positive S1-S2, no murmurs rubs or gallops appreciated ABDOMINAL: No  apparent ascites.  EXTREMITIES: No peripheral edema  HEMATOLOGIC: No significant bruising NEUROLOGIC: Oriented to person, place, and time. Nonfocal. Normal muscle tone.  PSYCHIATRIC: Normal mood and affect. Normal behavior. Cooperative  CARDIAC DATABASE: EKG: 08/21/2019: Normal sinus rhythm  Echocardiogram: PCP office 10/17/2018: Normal LVEF.  55-60%.  Normal size.  Mild LVH.  Diastolic dysfunction.  Mild aortic regurgitation.  Trace MR.  Trace TR.  Normal RV systolic pressure  Stress Testing:  None  Heart Catheterization: None  7 day Ziopatch 10/07/2018: NSR with rare PAC. Patient triggered events for palpitations correlated with sinus rhythm with rare PAC. Symptoms of lightheadedness correlated with sinus tachycardia at 121 bpm. No A fib or SVT noted.   LABORATORY DATA: CBC Latest Ref Rng & Units 05/02/2016 04/26/2015 08/08/2013  WBC 4.0 - 10.5 K/uL 6.6 5.7 -  Hemoglobin 12.0 - 15.0 g/dL 12.6 14.2 12.0  Hematocrit 36.0 - 46.0 % 37.5 44.4 -  Platelets 150.0 - 400.0 K/uL 219.0 204 -    CMP Latest Ref Rng & Units 04/26/2015 07/02/2013 04/15/2013  Glucose 65 - 99 mg/dL 101(H) 113(H) 93  BUN 6 - 20 mg/dL '13 9 10  ' Creatinine 0.44 - 1.00 mg/dL 0.72 0.73 0.69  Sodium 135 - 145 mmol/L 141 138 140  Potassium 3.5 - 5.1 mmol/L 3.3(L) 4.0 3.9  Chloride 101 - 111 mmol/L 104 102 106  CO2 22 - 32 mmol/L '29 28 25  ' Calcium  8.9 - 10.3 mg/dL 9.5 8.5 9.1  Total Protein 6.0 - 8.3 g/dL - - 7.3  Total Bilirubin 0.3 - 1.2 mg/dL - - 0.4  Alkaline Phos 39 - 117 U/L - - 26(L)  AST 0 - 37 U/L - - 24  ALT 0 - 35 U/L - - 12    Lipid Panel     Component Value Date/Time   CHOL 214 (H) 01/16/2019 1129   TRIG 154 (H) 01/16/2019 1129   HDL 63 01/16/2019 1129   CHOLHDL 3.4 01/16/2019 1129   CHOLHDL 2.8 04/15/2013 0836   VLDL 16 04/15/2013 0836   LDLCALC 124 (H) 01/16/2019 1129   LABVLDL 27 01/16/2019 1129    No results found for: HGBA1C No components found for: NTPROBNP Lab Results  Component Value  Date   TSH 1.016 04/15/2013   TSH 1.10 09/08/2009   TSH 0.56 05/31/2006    Cardiac Panel (last 3 results) No results for input(s): CKTOTAL, CKMB, TROPONINIHS, RELINDX in the last 72 hours.  FINAL MEDICATION LIST END OF ENCOUNTER: Meds ordered this encounter  Medications  . verapamil (CALAN SR) 120 MG CR tablet    Sig: Take 1 tablet (120 mg total) by mouth in the morning.    Dispense:  90 tablet    Refill:  0  . telmisartan (MICARDIS) 80 MG tablet    Sig: Take 1 tablet (80 mg total) by mouth every evening.    Dispense:  90 tablet    Refill:  1    Medications Discontinued During This Encounter  Medication Reason  . telmisartan (MICARDIS) 80 MG tablet Reorder     Current Outpatient Medications:  .  hydrochlorothiazide (MICROZIDE) 12.5 MG capsule, Take 1 capsule (12.5 mg total) by mouth daily., Disp: 30 capsule, Rfl: 2 .  Multiple Vitamins-Iron (MULTIVITAMINS WITH IRON) TABS tablet, Take 1 tablet by mouth daily., Disp: , Rfl:  .  omeprazole (PRILOSEC) 40 MG capsule, Take 1 capsule (40 mg total) by mouth daily., Disp: 30 capsule, Rfl: 3 .  telmisartan (MICARDIS) 80 MG tablet, Take 1 tablet (80 mg total) by mouth every evening., Disp: 90 tablet, Rfl: 1 .  verapamil (CALAN SR) 120 MG CR tablet, Take 1 tablet (120 mg total) by mouth in the morning., Disp: 90 tablet, Rfl: 0  IMPRESSION:    ICD-10-CM   1. Essential hypertension  I10 EKG 12-Lead    telmisartan (MICARDIS) 80 MG tablet    PCV RENAL/RENAL ARTERY DUPLEX COMPLETE    Magnesium    CMP14+EGFR    CMP14+EGFR    Magnesium    CANCELED: Basic metabolic panel  2. Family history of early CAD  Z34.49   3. Mild hyperlipidemia  E78.5   4. Palpitations  R00.2 Lipid Panel With LDL/HDL Ratio    Lipid Panel With LDL/HDL Ratio  5. Premature atrial contractions  I49.1 verapamil (CALAN SR) 120 MG CR tablet     RECOMMENDATIONS: Linda Castro is a 49 y.o. female whose past medical history and cardiovascular risk factors include:  Protein S deficiency, hypertension, hyper lipidemia, family history of premature coronary artery disease, symptomatic PACs.  Benign essential hypertension:  Patient's  blood pressure is currently not at goal.  Verapamil and  telmisartan refilled.  Patient is asked to keep a blood pressure log and to bring it in at the next office visit.  She is asked to call the office if her systolic blood pressures consistently higher than 140 mmHg for further medication titration.  Recommend discussing with  primary care provider for sleep study evaluation to screen for sleep apnea.  Check renal duplex to rule out renal artery stenosis  Check CMP and magnesium 1 week after restarting telmisartan to evaluate kidney function and electrolytes.  Mixed hyperlipidemia: Currently not on statin therapy.  Check fasting lipid profile.  Did not tolerate rosuvastatin well in the past secondary to rash.  Lifestyle modifications recommended.  Palpitations/premature atrial contractions: Refilled verapamil.  Patient is currently asymptomatic.  Continue to monitor.  Orders Placed This Encounter  Procedures  . Magnesium  . Lipid Panel With LDL/HDL Ratio  . CMP14+EGFR  . EKG 12-Lead  . PCV RENAL/RENAL ARTERY DUPLEX COMPLETE   --Continue cardiac medications as reconciled in final medication list. --Return in about 6 weeks (around 10/02/2019) for re-evaluation of symptoms., review test results.. Or sooner if needed. --Continue follow-up with your primary care physician regarding the management of your other chronic comorbid conditions.  Patient's questions and concerns were addressed to her satisfaction. She voices understanding of the instructions provided during this encounter.   During this visit I reviewed and updated: Tobacco history  allergies medication reconciliation  medical history  surgical history  family history  social history.  This note was created using a voice recognition software as a  result there may be grammatical errors inadvertently enclosed that do not reflect the nature of this encounter. Every attempt is made to correct such errors.  Rex Kras, [place], [facility]  Pager: [phone] Office: [phone]

## 2019-08-23 ENCOUNTER — Other Ambulatory Visit: Payer: Self-pay | Admitting: Cardiology

## 2019-08-23 DIAGNOSIS — Z8249 Family history of ischemic heart disease and other diseases of the circulatory system: Secondary | ICD-10-CM

## 2019-08-23 LAB — LIPID PANEL WITH LDL/HDL RATIO
Cholesterol, Total: 209 mg/dL — ABNORMAL HIGH (ref 100–199)
HDL: 69 mg/dL (ref >39)
LDL Chol Calc (NIH): 122 mg/dL — ABNORMAL HIGH (ref 0–99)
LDL/HDL Ratio: 1.8 ratio (ref 0.0–3.2)
Triglycerides: 100 mg/dL (ref 0–149)
VLDL Cholesterol Cal: 18 mg/dL (ref 5–40)

## 2019-08-23 LAB — CMP14+EGFR
ALT: 28 IU/L (ref 0–32)
AST: 41 IU/L — ABNORMAL HIGH (ref 0–40)
Albumin/Globulin Ratio: 2 (ref 1.2–2.2)
Albumin: 4.9 g/dL (ref 3.8–4.9)
Alkaline Phosphatase: 48 IU/L (ref 39–117)
BUN/Creatinine Ratio: 17 (ref 9–23)
BUN: 14 mg/dL (ref 6–24)
Bilirubin Total: 0.5 mg/dL (ref 0.0–1.2)
CO2: 23 mmol/L (ref 20–29)
Calcium: 9.5 mg/dL (ref 8.7–10.2)
Chloride: 103 mmol/L (ref 96–106)
Creatinine, Ser: 0.81 mg/dL (ref 0.57–1.00)
GFR calc Af Amer: 97 mL/min/{1.73_m2} (ref >59)
GFR calc non Af Amer: 84 mL/min/{1.73_m2} (ref >59)
Globulin, Total: 2.5 g/dL (ref 1.5–4.5)
Glucose: 100 mg/dL — ABNORMAL HIGH (ref 65–99)
Potassium: 3.9 mmol/L (ref 3.5–5.2)
Sodium: 143 mmol/L (ref 134–144)
Total Protein: 7.4 g/dL (ref 6.0–8.5)

## 2019-08-23 LAB — MAGNESIUM: Magnesium: 2.3 mg/dL (ref 1.6–2.3)

## 2019-08-25 ENCOUNTER — Other Ambulatory Visit: Payer: Self-pay

## 2019-08-25 DIAGNOSIS — I491 Atrial premature depolarization: Secondary | ICD-10-CM

## 2019-08-25 MED ORDER — VERAPAMIL HCL ER 120 MG PO TBCR: 120.0000 mg | EXTENDED_RELEASE_TABLET | Freq: Every morning | ORAL | 0 refills | Status: DC

## 2019-08-28 ENCOUNTER — Ambulatory Visit: Payer: BC Managed Care – PPO

## 2019-08-28 ENCOUNTER — Other Ambulatory Visit: Payer: Self-pay

## 2019-08-28 DIAGNOSIS — I10 Essential (primary) hypertension: Secondary | ICD-10-CM

## 2019-10-02 ENCOUNTER — Encounter: Payer: Self-pay | Admitting: Cardiology

## 2019-10-02 ENCOUNTER — Other Ambulatory Visit: Payer: Self-pay

## 2019-10-02 ENCOUNTER — Ambulatory Visit: Payer: BC Managed Care – PPO | Admitting: Cardiology

## 2019-10-02 VITALS — BP 142/89 | HR 76 | Ht 67.0 in | Wt 182.0 lb

## 2019-10-02 DIAGNOSIS — E785 Hyperlipidemia, unspecified: Secondary | ICD-10-CM

## 2019-10-02 DIAGNOSIS — R002 Palpitations: Secondary | ICD-10-CM

## 2019-10-02 DIAGNOSIS — I10 Essential (primary) hypertension: Secondary | ICD-10-CM

## 2019-10-02 DIAGNOSIS — Z712 Person consulting for explanation of examination or test findings: Secondary | ICD-10-CM

## 2019-10-02 DIAGNOSIS — Z8249 Family history of ischemic heart disease and other diseases of the circulatory system: Secondary | ICD-10-CM

## 2019-10-02 MED ORDER — HYDROCHLOROTHIAZIDE 25 MG PO TABS: 25.0000 mg | ORAL_TABLET | Freq: Every day | ORAL | 1 refills | Status: DC

## 2019-10-02 NOTE — Progress Notes (Signed)
[name] Date of Birth: [birth year] MRN: [id number] Primary Care Provider:Ramachandran, Mauro Kaufmann, MD Former Cardiology Providers: Jeri Lager, APRN, FNP-C Primary Cardiologist: Rex Kras, DO, [facility] (established care 08/21/2019)   Date: 10/02/19 Last Visit: 08/21/2019   Chief Complaint  Patient presents with  . Hypertension  . Follow-up    HPI  Linda Castro is a 49 y.o.  female who presents to the office with a chief complaint of " hypertension follow up." Patient's past medical history and cardiovascular risk factors include: Protein S deficiency, hypertension, hyperlipidemia, family history of premature coronary artery disease, symptomatic PACs.  Patient has been followed in the office for management of hypertension.  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  She was restarted on telmisartan and hydrochlorothiazide as well as verapamil.  She brings in her blood pressure log and majority of her blood pressure readings are still greater than-40 mmHg.  Renal duplex results reviewed with her at today's office visit.  Patient states that she consumes a low-salt diet.  She also exercises at least 7 days a week for 30 to 60 minutes and has lost weight since last visit.  At the last office visit patient not want to be on statin therapy.  Therefore recommended coronary artery calcification scoring for risk stratification given her family history of premature CAD.  However, patient has not had this completed since the last office visit.  FUNCTIONAL STATUS: She walks 7 days a week and at times run for atleast 30-60min.    ALLERGIES: Allergies  Allergen Reactions  . Hydrocodone Nausea And Vomiting  . Oxycodone Nausea And Vomiting  . Codeine Nausea And Vomiting  . Lisinopril Hives and Rash    Tingling sensation in face.      MEDICATION LIST PRIOR TO VISIT: Current Outpatient  Medications on File Prior to Visit  Medication Sig Dispense Refill  . Multiple Vitamins-Iron (MULTIVITAMINS WITH IRON) TABS tablet Take 1 tablet by mouth daily.    Marland Kitchen omeprazole (PRILOSEC) 40 MG capsule Take 1 capsule (40 mg total) by mouth daily. 30 capsule 3  . telmisartan (MICARDIS) 80 MG tablet Take 1 tablet (80 mg total) by mouth every evening. 90 tablet 1  . verapamil (CALAN SR) 120 MG CR tablet Take 1 tablet (120 mg total) by mouth in the morning. 90 tablet 0   No current facility-administered medications on file prior to visit.    PAST MEDICAL HISTORY: Past Medical History:  Diagnosis Date  . ANEMIA, IRON DEFICIENCY 09/08/2009  . BREAST CYST, RIGHT 03/10/2010  . Dysmenorrhea   . Dyspareunia   . FATIGUE 09/08/2009  . Fibroadenoma of right breast   . Fibroid   . HYPERTENSION 09/08/2009   history, no longer on medications  . 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 SEIZURE DISORDER 09/08/2009   childhood history, family history of seizures  . SYNCOPE 01/16/2010  . Urinary incontinence     PAST SURGICAL HISTORY: Past Surgical History:  Procedure Laterality Date  . ABDOMINAL HYSTERECTOMY Bilateral 07/01/2013   Procedure: TOTAL ABDOMINAL HYSTERECTOMY WITH BILATERAL SALPINGO OOPHORECTOMY;  Surgeon: Jamey Reas de Berton Lan, MD;  Location: [facility];  Service: Gynecology;  Laterality: Bilateral;  . APPENDECTOMY  2006  . BREAST SURGERY  2011   right--fibroadenoma removed  . [place]   x2  . TUBAL LIGATION  1996    FAMILY HISTORY: The patient's family  history includes Cancer in her mother; Colon cancer in her maternal grandmother and another family member; Diabetes in her sister and another family member; Heart attack in her father; Hyperlipidemia in her father; Hypertension in her father, mother, and another family member; Ovarian cancer in her mother; Seizures in her father; Stroke in an other family member.   SOCIAL HISTORY:  The patient  reports that she has never  smoked. She has never used smokeless tobacco. She reports current alcohol use of about 1.0 standard drink of alcohol per week. She reports that she does not use drugs.  Review of Systems  Constitutional: Negative for chills and fever.  HENT: Negative for hoarse voice and nosebleeds.   Eyes: Negative for discharge, double vision and pain.  Cardiovascular: Negative for chest pain, claudication, dyspnea on exertion, leg swelling, near-syncope, orthopnea, palpitations, paroxysmal nocturnal dyspnea and syncope.  Respiratory: Negative for hemoptysis and shortness of breath.   Musculoskeletal: Negative for muscle cramps and myalgias.  Gastrointestinal: Negative for abdominal pain, constipation, diarrhea, hematemesis, hematochezia, melena, nausea and vomiting.  Neurological: Negative for dizziness and light-headedness.    PHYSICAL EXAM: Vitals with BMI 10/02/2019 08/21/2019 03/17/2019  Height 5\' 7"  5\' 7"  5\' 7"   Weight 182 lbs 185 lbs 187 lbs 5 oz  BMI 28.5 14.48 18.56  Systolic 314 970 263  Diastolic 89 94 92  Pulse 76 80 89    CONSTITUTIONAL: Well-developed and well-nourished. No acute distress.  SKIN: Skin is warm and dry. No rash noted. No cyanosis. No pallor. No jaundice HEAD: Normocephalic and atraumatic.  EYES: No scleral icterus MOUTH/THROAT: Moist oral membranes.  NECK: No JVD present. No thyromegaly noted. No carotid bruits  LYMPHATIC: No visible cervical adenopathy.  CHEST Normal respiratory effort. No intercostal retractions  LUNGS: Clear to auscultation bilaterally. No stridor. No wheezes. No rales.  CARDIOVASCULAR: Regular rate and rhythm, positive S1-S2, no murmurs rubs or gallops appreciated. ABDOMINAL: No apparent ascites.  EXTREMITIES: No peripheral edema  HEMATOLOGIC: No significant bruising NEUROLOGIC: Oriented to person, place, and time. Nonfocal. Normal muscle tone.  PSYCHIATRIC: Normal mood and affect. Normal behavior. Cooperative  CARDIAC  DATABASE: EKG: 08/21/2019: Normal sinus rhythm  Echocardiogram: PCP office 10/17/2018: Normal LVEF. 55-60%. Normal size. Mild LVH. Diastolic dysfunction. Mild aortic regurgitation. Trace MR. Trace TR. Normal RV systolic pressure  Stress Testing:  None  Heart Catheterization: None  7 day Ziopatch 10/07/2018:NSR with rare PAC. Patient triggered events for palpitations correlated with sinus rhythm with rare PAC. Symptoms of lightheadedness correlated with sinus tachycardia at 121 bpm. No A fib or SVT noted.   Renal artery duplex 08/28/2019:  Normal intrarenal vascular perfusion is noted in both kidneys.  No evidence of renal artery occlusive disease in either renal artery.  Renal length is within normal limits for both kidneys.  Normal abdominal aorta flow velocities noted.  LABORATORY DATA: CBC Latest Ref Rng & Units 05/02/2016 04/26/2015 08/08/2013  WBC 4.0 - 10.5 K/uL 6.6 5.7 -  Hemoglobin 12.0 - 15.0 g/dL 12.6 14.2 12.0  Hematocrit 36 - 46 % 37.5 44.4 -  Platelets 150 - 400 K/uL 219.0 204 -    CMP Latest Ref Rng & Units 08/22/2019 04/26/2015 07/02/2013  Glucose 65 - 99 mg/dL 100(H) 101(H) 113(H)  BUN 6 - 24 mg/dL 14 13 9   Creatinine 0.57 - 1.00 mg/dL 0.81 0.72 0.73  Sodium 134 - 144 mmol/L 143 141 138  Potassium 3.5 - 5.2 mmol/L 3.9 3.3(L) 4.0  Chloride 96 - 106 mmol/L 103 104 102  CO2 20 - 29 mmol/L 23 29 28   Calcium 8.7 - 10.2 mg/dL 9.5 9.5 8.5  Total Protein 6.0 - 8.5 g/dL 7.4 - -  Total Bilirubin 0.0 - 1.2 mg/dL 0.5 - -  Alkaline Phos 39 - 117 IU/L 48 - -  AST 0 - 40 IU/L 41(H) - -  ALT 0 - 32 IU/L 28 - -    Lipid Panel     Component Value Date/Time   CHOL 209 (H) 08/22/2019 0831   TRIG 100 08/22/2019 0831   HDL 69 08/22/2019 0831   CHOLHDL 3.4 01/16/2019 1129   CHOLHDL 2.8 04/15/2013 0836   VLDL 16 04/15/2013 0836   LDLCALC 122 (H) 08/22/2019 0831   LABVLDL 18 08/22/2019 0831    No results found for: HGBA1C No components found for: NTPROBNP Lab  Results  Component Value Date   TSH 1.016 04/15/2013   TSH 1.10 09/08/2009   TSH 0.56 05/31/2006    Cardiac Panel (last 3 results) No results for input(s): CKTOTAL, CKMB, TROPONINIHS, RELINDX in the last 72 hours.  IMPRESSION:    ICD-10-CM   1. Essential hypertension  J24 Basic metabolic panel    Magnesium    hydrochlorothiazide (HYDRODIURIL) 25 MG tablet  2. Mild hyperlipidemia  E78.5 Lipid Panel With LDL/HDL Ratio  3. Palpitations  R00.2   4. Family history of early CAD  Z82.49      RECOMMENDATIONS: TYQUASIA PANT is a 49 y.o. female whose past medical history and cardiovascular risk factors include: hyperlipidemia." Patient's past medical history and cardiovascular risk factors include: Protein S deficiency, hypertension, hyperlipidemia, family history of premature coronary artery disease, symptomatic PACs.  Benign essential hypertension:  Patient's  blood pressure is currently not at goal.  Increase hydrochlorothiazide to 25 mg p.o. every morning.  Blood pressure log reviewed.  She is asked to call the office if her systolic blood pressures consistently higher than 140 mmHg for further medication titration.  Recommend discussing with primary care provider for sleep study evaluation to screen for sleep apnea.  Renal duplex results reviewed with the patient at today's visit.  Check BMP and magnesium 1 week after increasing hydrochlorothiazide to evaluate kidney function and electrolytes.  Mixed hyperlipidemia: Currently not on statin therapy.  Check fasting lipid profile.  Did not tolerate rosuvastatin well in the past secondary to rash.  Lifestyle modifications recommended.  Coronary artery calcification score for further risk stratification.  Palpitations/premature atrial contractions: Continue verapamil.    Chronic and stable.  Will monitor.   FINAL MEDICATION LIST END OF ENCOUNTER: Meds ordered this encounter  Medications  . hydrochlorothiazide (HYDRODIURIL)  25 MG tablet    Sig: Take 1 tablet (25 mg total) by mouth daily.    Dispense:  90 tablet    Refill:  1    Medications Discontinued During This Encounter  Medication Reason  . hydrochlorothiazide (MICROZIDE) 12.5 MG capsule Dose change     Current Outpatient Medications:  Marland Kitchen  Multiple Vitamins-Iron (MULTIVITAMINS WITH IRON) TABS tablet, Take 1 tablet by mouth daily., Disp: , Rfl:  .  omeprazole (PRILOSEC) 40 MG capsule, Take 1 capsule (40 mg total) by mouth daily., Disp: 30 capsule, Rfl: 3 .  telmisartan (MICARDIS) 80 MG tablet, Take 1 tablet (80 mg total) by mouth every evening., Disp: 90 tablet, Rfl: 1 .  verapamil (CALAN SR) 120 MG CR tablet, Take 1 tablet (120 mg total) by mouth in the morning., Disp: 90 tablet, Rfl: 0 .  hydrochlorothiazide (HYDRODIURIL) 25 MG tablet,  Take 1 tablet (25 mg total) by mouth daily., Disp: 90 tablet, Rfl: 1  Orders Placed This Encounter  Procedures  . Basic metabolic panel  . Magnesium  . Lipid Panel With LDL/HDL Ratio   --Continue cardiac medications as reconciled in final medication list. --Return in about 6 weeks (around 11/13/2019) for BP follow up.. Or sooner if needed. --Continue follow-up with your primary care physician regarding the management of your other chronic comorbid conditions.  Patient's questions and concerns were addressed to her satisfaction. She voices understanding of the instructions provided during this encounter.   During this visit I reviewed and updated: Tobacco history  allergies medication reconciliation  medical history  surgical history  family history  social history.  This note was created using a voice recognition software as a result there may be grammatical errors inadvertently enclosed that do not reflect the nature of this encounter. Every attempt is made to correct such errors.  Rex Kras, [place], [facility]  Pager: [phone] Office: [phone]

## 2019-10-08 ENCOUNTER — Other Ambulatory Visit: Payer: Self-pay

## 2019-10-08 DIAGNOSIS — E785 Hyperlipidemia, unspecified: Secondary | ICD-10-CM

## 2019-10-08 DIAGNOSIS — I10 Essential (primary) hypertension: Secondary | ICD-10-CM

## 2019-10-11 LAB — BASIC METABOLIC PANEL
BUN/Creatinine Ratio: 17 (ref 9–23)
BUN: 15 mg/dL (ref 6–24)
CO2: 23 mmol/L (ref 20–29)
Calcium: 9.5 mg/dL (ref 8.7–10.2)
Chloride: 105 mmol/L (ref 96–106)
Creatinine, Ser: 0.9 mg/dL (ref 0.57–1.00)
GFR calc Af Amer: 86 mL/min/{1.73_m2} (ref >59)
GFR calc non Af Amer: 74 mL/min/{1.73_m2} (ref >59)
Glucose: 94 mg/dL (ref 65–99)
Potassium: 3.7 mmol/L (ref 3.5–5.2)
Sodium: 143 mmol/L (ref 134–144)

## 2019-10-11 LAB — LIPID PANEL WITH LDL/HDL RATIO
Cholesterol, Total: 201 mg/dL — ABNORMAL HIGH (ref 100–199)
HDL: 59 mg/dL (ref >39)
LDL Chol Calc (NIH): 122 mg/dL — ABNORMAL HIGH (ref 0–99)
LDL/HDL Ratio: 2.1 ratio (ref 0.0–3.2)
Triglycerides: 113 mg/dL (ref 0–149)
VLDL Cholesterol Cal: 20 mg/dL (ref 5–40)

## 2019-10-11 LAB — MAGNESIUM: Magnesium: 1.9 mg/dL (ref 1.6–2.3)

## 2019-11-12 ENCOUNTER — Ambulatory Visit: Payer: BC Managed Care – PPO | Admitting: Cardiology

## 2019-11-12 ENCOUNTER — Other Ambulatory Visit: Payer: Self-pay | Admitting: Cardiology

## 2019-11-13 ENCOUNTER — Ambulatory Visit: Payer: BC Managed Care – PPO | Admitting: Cardiology

## 2019-11-21 ENCOUNTER — Ambulatory Visit: Payer: BC Managed Care – PPO | Admitting: Cardiology

## 2019-11-21 ENCOUNTER — Encounter: Payer: Self-pay | Admitting: Cardiology

## 2019-11-21 ENCOUNTER — Other Ambulatory Visit: Payer: Self-pay

## 2019-11-21 VITALS — BP 145/97 | HR 71 | Ht 67.0 in | Wt 180.6 lb

## 2019-11-21 DIAGNOSIS — Z712 Person consulting for explanation of examination or test findings: Secondary | ICD-10-CM

## 2019-11-21 DIAGNOSIS — E785 Hyperlipidemia, unspecified: Secondary | ICD-10-CM

## 2019-11-21 DIAGNOSIS — Z8249 Family history of ischemic heart disease and other diseases of the circulatory system: Secondary | ICD-10-CM

## 2019-11-21 DIAGNOSIS — I10 Essential (primary) hypertension: Secondary | ICD-10-CM

## 2019-11-21 DIAGNOSIS — I491 Atrial premature depolarization: Secondary | ICD-10-CM

## 2019-11-21 DIAGNOSIS — R002 Palpitations: Secondary | ICD-10-CM

## 2019-11-21 MED ORDER — VALSARTAN 160 MG PO TABS: 160.0000 mg | ORAL_TABLET | Freq: Every evening | ORAL | 0 refills | Status: DC

## 2019-11-21 NOTE — Progress Notes (Signed)
[name] Date of Birth: 03/27/1971 MRN: [id number] Primary Care Provider:Ramachandran, Mauro Kaufmann, MD Former Cardiology Providers: Jeri Lager, APRN, FNP-C Primary Cardiologist: Rex Kras, DO, [facility] (established care 08/21/2019)   Date: 11/21/2019 Last Office Visit: 10/02/2019  Chief Complaint  Patient presents with  . Follow-up  . Hypertension    HPI  Linda Castro is a 49 y.o.  female who presents to the office with a chief complaint of " hypertension follow up." Patient's past medical history and cardiovascular risk factors include: Protein S deficiency, hypertension, hyperlipidemia, family history of premature coronary artery disease, symptomatic PACs.  Patient has been followed in the office for management of hypertension.  Prior to establishing care with myself patient states that she had run out of her antihypertensive medications due to loss of insurance coverage.  Since then patient was restarted on her antihypertensive medications including telmisartan hydrochlorothiazide as well as verapamil.  Patient has also been working on lifestyle modifications and decreasing salt in her diet.  Patient has lost approximately 2 pounds in the last office visit.  She tries to be physically active on a weekly basis.  Patient states that her home blood pressures are still not well controlled as she is getting systolic blood pressures around 140 mmHg or at times higher.  Patient states that in the past she was on valsartan and she had good results with that and would like to transition back to that medication.   Given her lipid profile in the past and history of premature family history of CAD she was recommended to be on statin therapy.  However, patient was reluctant and therefore we decided to proceed with coronary artery calcification scoring.  I was pleased to inform the patient that her coronary calcification score is 0.  But still recommend aggressive lifestyle modifications to help  improve her cardiovascular risk factors.  FUNCTIONAL STATUS: She walks 7 days a week and at times run for atleast 30-45min.    ALLERGIES: Allergies  Allergen Reactions  . Hydrocodone Nausea And Vomiting  . Oxycodone Nausea And Vomiting  . Codeine Nausea And Vomiting  . Lisinopril Hives and Rash    Tingling sensation in face.      MEDICATION LIST PRIOR TO VISIT: Current Outpatient Medications on File Prior to Visit  Medication Sig Dispense Refill  . hydrochlorothiazide (HYDRODIURIL) 25 MG tablet Take 1 tablet (25 mg total) by mouth daily. 90 tablet 1  . Multiple Vitamins-Iron (MULTIVITAMINS WITH IRON) TABS tablet Take 1 tablet by mouth daily.    Marland Kitchen omeprazole (PRILOSEC) 40 MG capsule Take 1 capsule (40 mg total) by mouth daily. 30 capsule 3  . verapamil (CALAN SR) 120 MG CR tablet Take 1 tablet (120 mg total) by mouth in the morning. 90 tablet 0  . telmisartan (MICARDIS) 40 MG tablet TAKE 1 TABLET BY MOUTH EVERY DAY 90 tablet 1   No current facility-administered medications on file prior to visit.    PAST MEDICAL HISTORY: Past Medical History:  Diagnosis Date  . ANEMIA, IRON DEFICIENCY 09/08/2009  . BREAST CYST, RIGHT 03/10/2010  . Dysmenorrhea   . Dyspareunia   . FATIGUE 09/08/2009  . Fibroadenoma of right breast   . Fibroid   . HYPERTENSION 09/08/2009   history, no longer on medications  . 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 SEIZURE DISORDER 09/08/2009   childhood history, family history of seizures  . SYNCOPE 01/16/2010  . Urinary incontinence     PAST SURGICAL HISTORY: Past Surgical History:  Procedure Laterality Date  .  ABDOMINAL HYSTERECTOMY Bilateral 07/01/2013   Procedure: TOTAL ABDOMINAL HYSTERECTOMY WITH BILATERAL SALPINGO OOPHORECTOMY;  Surgeon: Jamey Reas de Berton Lan, MD;  Location: [facility];  Service: Gynecology;  Laterality: Bilateral;  . APPENDECTOMY  2006  . BREAST SURGERY  2011   right--fibroadenoma removed  . [place]   x2  . TUBAL  LIGATION  1996    FAMILY HISTORY: The patient's family history includes Cancer in her mother; Colon cancer in her maternal grandmother and another family member; Diabetes in her sister and another family member; Heart attack in her father; Hyperlipidemia in her father; Hypertension in her father, mother, and another family member; Ovarian cancer in her mother; Seizures in her father; Stroke in an other family member.   SOCIAL HISTORY:  The patient  reports that she has never smoked. She has never used smokeless tobacco. She reports current alcohol use of about 1.0 standard drink of alcohol per week. She reports that she does not use drugs.  Review of Systems  Constitutional: Negative for chills and fever.  HENT: Negative for hoarse voice and nosebleeds.   Eyes: Negative for discharge, double vision and pain.  Cardiovascular: Negative for chest pain, claudication, dyspnea on exertion, leg swelling, near-syncope, orthopnea, palpitations, paroxysmal nocturnal dyspnea and syncope.  Respiratory: Negative for hemoptysis and shortness of breath.   Musculoskeletal: Negative for muscle cramps and myalgias.  Gastrointestinal: Negative for abdominal pain, constipation, diarrhea, hematemesis, hematochezia, melena, nausea and vomiting.  Neurological: Negative for dizziness and light-headedness.    PHYSICAL EXAM: Vitals with BMI 11/21/2019 10/02/2019 08/21/2019  Height 5\' 7"  5\' 7"  5\' 7"   Weight 180 lbs 10 oz 182 lbs 185 lbs  BMI 28.28 16.1 09.60  Systolic 454 098 119  Diastolic 97 89 94  Pulse 71 76 80    CONSTITUTIONAL: Well-developed and well-nourished. No acute distress.  SKIN: Skin is warm and dry. No rash noted. No cyanosis. No pallor. No jaundice HEAD: Normocephalic and atraumatic.  EYES: No scleral icterus MOUTH/THROAT: Moist oral membranes.  NECK: No JVD present. No thyromegaly noted. No carotid bruits  LYMPHATIC: No visible cervical adenopathy.  CHEST Normal respiratory effort. No  intercostal retractions  LUNGS: Clear to auscultation bilaterally. No stridor. No wheezes. No rales.  CARDIOVASCULAR: Regular rate and rhythm, positive S1-S2, no murmurs rubs or gallops appreciated. ABDOMINAL: No apparent ascites.  EXTREMITIES: No peripheral edema  HEMATOLOGIC: No significant bruising NEUROLOGIC: Oriented to person, place, and time. Nonfocal. Normal muscle tone.  PSYCHIATRIC: Normal mood and affect. Normal behavior. Cooperative  CARDIAC DATABASE: EKG: 08/21/2019: Normal sinus rhythm  Echocardiogram: PCP office 10/17/2018: Normal LVEF. 55-60%. Normal size. Mild LVH. Diastolic dysfunction. Mild aortic regurgitation. Trace MR. Trace TR. Normal RV systolic pressure  Stress Testing:  None  Heart Catheterization: None  7 day [place] 10/07/2018:NSR with rare PAC. Patient triggered events for palpitations correlated with sinus rhythm with rare PAC. Symptoms of lightheadedness correlated with sinus tachycardia at 121 bpm. No A fib or SVT noted.   Renal artery duplex 08/28/2019:  Normal intrarenal vascular perfusion is noted in both kidneys.  No evidence of renal artery occlusive disease in either renal artery.  Renal length is within normal limits for both kidneys.  Normal abdominal aorta flow velocities noted.  Coronary artery calcification scoring 10/06/2019: Total calcium score is 0.  LABORATORY DATA: CBC Latest Ref Rng & Units 05/02/2016 04/26/2015 08/08/2013  WBC 4.0 - 10.5 K/uL 6.6 5.7 -  Hemoglobin 12.0 - 15.0 g/dL 12.6 14.2 12.0  Hematocrit 36 - 46 % 37.5 44.4 -  Platelets 150 - 400 K/uL 219.0 204 -    CMP Latest Ref Rng & Units 10/10/2019 08/22/2019 04/26/2015  Glucose 65 - 99 mg/dL 94 100(H) 101(H)  BUN 6 - 24 mg/dL 15 14 13   Creatinine 0.57 - 1.00 mg/dL 0.90 0.81 0.72  Sodium 134 - 144 mmol/L 143 143 141  Potassium 3.5 - 5.2 mmol/L 3.7 3.9 3.3(L)  Chloride 96 - 106 mmol/L 105 103 104  CO2 20 - 29 mmol/L 23 23 29   Calcium 8.7 - 10.2 mg/dL 9.5 9.5  9.5  Total Protein 6.0 - 8.5 g/dL - 7.4 -  Total Bilirubin 0.0 - 1.2 mg/dL - 0.5 -  Alkaline Phos 39 - 117 IU/L - 48 -  AST 0 - 40 IU/L - 41(H) -  ALT 0 - 32 IU/L - 28 -    Lipid Panel     Component Value Date/Time   CHOL 201 (H) 10/10/2019 0812   TRIG 113 10/10/2019 0812   HDL 59 10/10/2019 0812   CHOLHDL 3.4 01/16/2019 1129   CHOLHDL 2.8 04/15/2013 0836   VLDL 16 04/15/2013 0836   LDLCALC 122 (H) 10/10/2019 0812   LABVLDL 20 10/10/2019 0812    No results found for: HGBA1C No components found for: NTPROBNP Lab Results  Component Value Date   TSH 1.016 04/15/2013   TSH 1.10 09/08/2009   TSH 0.56 05/31/2006    Cardiac Panel (last 3 results) No results for input(s): CKTOTAL, CKMB, TROPONINIHS, RELINDX in the last 72 hours.  IMPRESSION:    ICD-10-CM   1. Essential hypertension  I10 valsartan (DIOVAN) 160 MG tablet    Basic metabolic panel    Magnesium  2. Mild hyperlipidemia  E78.5   3. Family history of early CAD  Z45.49   23. Palpitations  R00.2   5. Premature atrial contractions  I49.1   6. Encounter to discuss test results  Z71.2      RECOMMENDATIONS: RONNETT PULLIN is a 49 y.o. female whose past medical history and cardiovascular risk factors include: hyperlipidemia." Patient's past medical history and cardiovascular risk factors include: Protein S deficiency, hypertension, hyperlipidemia, family history of premature coronary artery disease, symptomatic PACs.  Benign essential hypertension:  Patient's  blood pressure is currently not at goal.  Increase hydrochlorothiazide to 25 mg p.o. every morning.  Blood pressure log reviewed.  She is asked to call the office if her systolic blood pressures consistently higher than 140 mmHg for further medication titration.  Recommend discussing with primary care provider for sleep study evaluation to screen for sleep apnea.  Renal duplex did not note renal artery stenosis.  Discontinue telmisartan.  Start  valsartan.  Check BMP and magnesium 1 week after starting valsartan to evaluate kidney function and electrolytes.  Mixed hyperlipidemia:   Currently not on statin therapy.    She is encouraged to continue improving cardiovascular risk factors given her history of premature CAD in the family.  Coronary artery calcification score is 0.  Did not tolerate rosuvastatin well in the past secondary to rash.    Palpitations/premature atrial contractions: Continue verapamil.    Chronic and stable.  Will monitor.   Reviewed the results of the coronary artery calcification scoring with the patient at today's office visit.  Her questions or concerns addressed to her satisfaction.  FINAL MEDICATION LIST END OF ENCOUNTER: Meds ordered this encounter  Medications  . valsartan (DIOVAN) 160 MG tablet    Sig: Take 1  tablet (160 mg total) by mouth every evening.    Dispense:  30 tablet    Refill:  0    Medications Discontinued During This Encounter  Medication Reason  . telmisartan (MICARDIS) 80 MG tablet Patient Preference     Current Outpatient Medications:  .  hydrochlorothiazide (HYDRODIURIL) 25 MG tablet, Take 1 tablet (25 mg total) by mouth daily., Disp: 90 tablet, Rfl: 1 .  Multiple Vitamins-Iron (MULTIVITAMINS WITH IRON) TABS tablet, Take 1 tablet by mouth daily., Disp: , Rfl:  .  omeprazole (PRILOSEC) 40 MG capsule, Take 1 capsule (40 mg total) by mouth daily., Disp: 30 capsule, Rfl: 3 .  verapamil (CALAN SR) 120 MG CR tablet, Take 1 tablet (120 mg total) by mouth in the morning., Disp: 90 tablet, Rfl: 0 .  telmisartan (MICARDIS) 40 MG tablet, TAKE 1 TABLET BY MOUTH EVERY DAY, Disp: 90 tablet, Rfl: 1 .  valsartan (DIOVAN) 160 MG tablet, Take 1 tablet (160 mg total) by mouth every evening., Disp: 30 tablet, Rfl: 0  Orders Placed This Encounter  Procedures  . Basic metabolic panel  . Magnesium   --Continue cardiac medications as reconciled in final medication list. --Return in about  3 months (around 02/21/2020) for BP follow up.. Or sooner if needed. --Continue follow-up with your primary care physician regarding the management of your other chronic comorbid conditions.  Patient's questions and concerns were addressed to her satisfaction. She voices understanding of the instructions provided during this encounter.   This note was created using a voice recognition software as a result there may be grammatical errors inadvertently enclosed that do not reflect the nature of this encounter. Every attempt is made to correct such errors.  Rex Kras, [place], [facility]  Pager: [phone] Office: [phone]

## 2019-11-26 ENCOUNTER — Other Ambulatory Visit: Payer: Self-pay

## 2019-11-26 DIAGNOSIS — I10 Essential (primary) hypertension: Secondary | ICD-10-CM

## 2019-12-02 LAB — BASIC METABOLIC PANEL
BUN/Creatinine Ratio: 16 (ref 9–23)
BUN: 13 mg/dL (ref 6–24)
CO2: 25 mmol/L (ref 20–29)
Calcium: 9.6 mg/dL (ref 8.7–10.2)
Chloride: 104 mmol/L (ref 96–106)
Creatinine, Ser: 0.81 mg/dL (ref 0.57–1.00)
GFR calc Af Amer: 97 mL/min/{1.73_m2} (ref >59)
GFR calc non Af Amer: 84 mL/min/{1.73_m2} (ref >59)
Glucose: 102 mg/dL — ABNORMAL HIGH (ref 65–99)
Potassium: 4.2 mmol/L (ref 3.5–5.2)
Sodium: 143 mmol/L (ref 134–144)

## 2019-12-02 LAB — MAGNESIUM: Magnesium: 2.2 mg/dL (ref 1.6–2.3)

## 2019-12-14 ENCOUNTER — Other Ambulatory Visit: Payer: Self-pay | Admitting: Cardiology

## 2019-12-14 DIAGNOSIS — I10 Essential (primary) hypertension: Secondary | ICD-10-CM

## 2020-01-01 ENCOUNTER — Other Ambulatory Visit: Payer: Self-pay

## 2020-01-01 DIAGNOSIS — I491 Atrial premature depolarization: Secondary | ICD-10-CM

## 2020-01-01 MED ORDER — VERAPAMIL HCL ER 120 MG PO TBCR: 120.0000 mg | EXTENDED_RELEASE_TABLET | Freq: Every morning | ORAL | 1 refills | Status: DC

## 2020-02-23 ENCOUNTER — Ambulatory Visit: Payer: BC Managed Care – PPO | Admitting: Cardiology

## 2020-03-26 ENCOUNTER — Ambulatory Visit: Payer: Self-pay | Attending: Family

## 2020-03-26 DIAGNOSIS — Z23 Encounter for immunization: Secondary | ICD-10-CM

## 2020-05-30 ENCOUNTER — Other Ambulatory Visit: Payer: Self-pay | Admitting: Cardiology

## 2020-05-30 DIAGNOSIS — I10 Essential (primary) hypertension: Secondary | ICD-10-CM

## 2020-07-21 NOTE — Progress Notes (Signed)
   Covid-19 Vaccination Clinic  Name:  Linda Castro    MRN: [id number] DOB: Jun 25, 1970  07/21/2020  [name] was observed post Covid-19 immunization for 15 minutes without incident. She was provided with Vaccine Information Sheet and instruction to access the V-Safe system.   [name] was instructed to call 911 with any severe reactions post vaccine: Marland Kitchen Difficulty breathing  . Swelling of face and throat  . A fast heartbeat  . A bad rash all over body  . Dizziness and weakness   Immunizations Administered    Name Date Dose VIS Date Route   Moderna Covid-19 Booster Vaccine 03/26/2020  1:30 PM 0.25 mL 02/11/2020 Intramuscular   Manufacturer: Moderna   Lot: [id number]   [place]: [phone]

## 2020-09-23 ENCOUNTER — Ambulatory Visit
Admission: EM | Admit: 2020-09-23 | Discharge: 2020-09-23 | Disposition: A | Discharge status: Home / Self Care | Payer: BC Managed Care – PPO | Attending: Emergency Medicine | Admitting: Emergency Medicine

## 2020-09-23 DIAGNOSIS — J069 Acute upper respiratory infection, unspecified: Secondary | ICD-10-CM | POA: Diagnosis not present

## 2020-09-23 MED ORDER — DOXYCYCLINE HYCLATE 100 MG PO CAPS: 100.0000 mg | ORAL_CAPSULE | Freq: Two times a day (BID) | ORAL | 0 refills | Status: AC

## 2020-09-23 MED ORDER — DOXYCYCLINE HYCLATE 100 MG PO CAPS: 100.0000 mg | ORAL_CAPSULE | Freq: Two times a day (BID) | ORAL | 0 refills | Status: DC

## 2020-09-23 MED ORDER — PROMETHAZINE-DM 6.25-15 MG/5ML PO SYRP: 5.0000 mL | ORAL_SOLUTION | Freq: Every evening | ORAL | 0 refills | Status: DC | PRN

## 2020-09-23 MED ORDER — BENZONATATE 200 MG PO CAPS: 200.0000 mg | ORAL_CAPSULE | Freq: Three times a day (TID) | ORAL | 0 refills | Status: AC | PRN

## 2020-09-23 NOTE — ED Provider Notes (Signed)
[facility]    CSN: [id number] Arrival date & time: 09/23/20  1731      History   Chief Complaint Chief Complaint  Patient presents with  . Cough  . Nasal Congestion  . Headache    HPI Linda Castro is a 50 y.o. female presenting today for evaluation of URI symptoms.  Reports associated cough congestion and postnasal drainage over the past 4 to 5 days.  She reports a thick drainage into her throat.  Has had associated cough.  Denies fevers.  Denies nausea vomiting or diarrhea.  Using Mucinex without full relief.  Also has already on Allegra daily.  HPI  Past Medical History:  Diagnosis Date  . ANEMIA, IRON DEFICIENCY 09/08/2009  . BREAST CYST, RIGHT 03/10/2010  . Dysmenorrhea   . Dyspareunia   . FATIGUE 09/08/2009  . Fibroadenoma of right breast   . Fibroid   . HYPERTENSION 09/08/2009   history, no longer on medications  . 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 SEIZURE DISORDER 09/08/2009   childhood history, family history of seizures  . SYNCOPE 01/16/2010  . Urinary incontinence     Patient Active Problem List   Diagnosis Date Noted  . Cough 01/20/2016  . Exertional dyspnea 01/20/2016  . Snoring 01/20/2016  . BREAST CYST, RIGHT 03/10/2010  . 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 SYNCOPE 01/16/2010  . ANEMIA, IRON DEFICIENCY 09/08/2009  . HYPERTENSION 09/08/2009  . SEIZURE DISORDER 09/08/2009  . FATIGUE 09/08/2009    Past Surgical History:  Procedure Laterality Date  . ABDOMINAL HYSTERECTOMY Bilateral 07/01/2013   Procedure: TOTAL ABDOMINAL HYSTERECTOMY WITH BILATERAL SALPINGO OOPHORECTOMY;  Surgeon: Jamey Reas de Berton Lan, MD;  Location: [facility];  Service: Gynecology;  Laterality: Bilateral;  . APPENDECTOMY  2006  . BREAST SURGERY  2011   right--fibroadenoma removed  . [place]   x2  . TUBAL LIGATION  1996    OB History    Gravida  3   Para  3   Term  3   Preterm      AB      Living  3     SAB      IAB      Ectopic       Multiple      Live Births               Home Medications    Prior to Admission medications   Medication Sig Start Date End Date Taking? Authorizing Provider  benzonatate (TESSALON) 200 MG capsule Take 1 capsule (200 mg total) by mouth 3 (three) times daily as needed for up to 7 days for cough. 09/23/20 09/30/20 Yes Ledell Codrington C, PA-C  promethazine-dextromethorphan (PROMETHAZINE-DM) 6.25-15 MG/5ML syrup Take 5 mLs by mouth at bedtime as needed for cough. 09/23/20  Yes Maxx Pham C, PA-C  doxycycline (VIBRAMYCIN) 100 MG capsule Take 1 capsule (100 mg total) by mouth 2 (two) times daily for 7 days. 09/23/20 09/30/20  Clea Dubach C, PA-C  hydrochlorothiazide (HYDRODIURIL) 25 MG tablet Take 1 tablet (25 mg total) by mouth daily. 10/02/19 03/30/20  Tolia, Sunit, DO  Multiple Vitamins-Iron (MULTIVITAMINS WITH IRON) TABS tablet Take 1 tablet by mouth daily.    [provider]  omeprazole (PRILOSEC) 40 MG capsule Take 1 capsule (40 mg total) by mouth daily. 01/20/16   de Dios, [place] A, MD  telmisartan (MICARDIS) 40 MG tablet TAKE 1 TABLET BY MOUTH EVERY DAY 11/21/19   Terri Skains, Sunit, DO  valsartan (DIOVAN) 160 MG tablet TAKE 1 TABLET (160 MG TOTAL) BY MOUTH EVERY EVENING. 06/01/20   Tolia, Sunit, DO  verapamil (CALAN SR) 120 MG CR tablet Take 1 tablet (120 mg total) by mouth in the morning. 01/01/20 03/31/20  Rex Kras, DO    Family History Family History  Problem Relation Age of Onset  . Cancer Mother        peritoneal/?ovarian cells  . Ovarian cancer Mother        ?  Marland Kitchen Hypertension Mother   . Heart attack Father   . Hypertension Father   . Hyperlipidemia Father   . Seizures Father        epilepsy  . Diabetes Sister   . Colon cancer Other        grandparent  . Diabetes Other        parent, grandparent, other relative  . Hypertension Other        parent, other relative  . Stroke Other        parent, other relative  . Colon cancer Maternal Grandmother     Social  History Social History   Tobacco Use  . Smoking status: Never Smoker  . Smokeless tobacco: Never Used  . Tobacco comment: Married, lives at home with spouse & 3 dtrs. Employed as admin asst @ [employer]  . Vaping Use: Never used  Substance Use Topics  . Alcohol use: Yes    Alcohol/week: 1.0 standard drink    Types: 1 Standard drinks or equivalent per week    Comment: sometimes  . Drug use: No     Allergies   Hydrocodone, Oxycodone, Amoxicillin, Codeine, and Lisinopril   Review of Systems Review of Systems  Constitutional: Negative for activity change, appetite change, chills, fatigue and fever.  HENT: Positive for congestion, postnasal drip and sore throat. Negative for ear pain, rhinorrhea, sinus pressure and trouble swallowing.   Eyes: Negative for discharge and redness.  Respiratory: Positive for cough. Negative for chest tightness and shortness of breath.   Cardiovascular: Negative for chest pain.  Gastrointestinal: Negative for abdominal pain, diarrhea, nausea and vomiting.  Musculoskeletal: Negative for myalgias.  Skin: Negative for rash.  Neurological: Negative for dizziness, light-headedness and headaches.     Physical Exam Triage Vital Signs ED Triage Vitals  Enc Vitals Group     BP 09/23/20 1837 (!) 143/88     Pulse Rate 09/23/20 1837 72     Resp 09/23/20 1837 16     Temp 09/23/20 1837 98.5 F (36.9 C)     Temp Source 09/23/20 1837 Oral     SpO2 09/23/20 1837 96 %     Weight --      Height --      Head Circumference --      Peak Flow --      Pain Score 09/23/20 1836 0     Pain Loc --      Pain Edu? --      Excl. in [place]? --    No data found.  Updated Vital Signs BP (!) 143/88 (BP Location: Right Arm)   Pulse 72   Temp 98.5 F (36.9 C) (Oral)   Resp 16   LMP 06/06/2013   SpO2 96%   Visual Acuity Right Eye Distance:   Left Eye Distance:   Bilateral Distance:    Right Eye Near:   Left Eye Near:    Bilateral  Near:     Physical Exam  Vitals and nursing note reviewed.  Constitutional:      Appearance: She is well-developed.     Comments: No acute distress  HENT:     Head: Normocephalic and atraumatic.     Ears:     Comments: Bilateral ears without tenderness to palpation of external auricle, tragus and mastoid, EAC's without erythema or swelling, TM's with good bony landmarks and cone of light. Non erythematous.     Nose: Nose normal.     Mouth/Throat:     Comments: Oral mucosa pink and moist, no tonsillar enlargement or exudate. Posterior pharynx patent and nonerythematous, no uvula deviation or swelling. Normal phonation. Eyes:     Conjunctiva/sclera: Conjunctivae normal.  Cardiovascular:     Rate and Rhythm: Normal rate and regular rhythm.  Pulmonary:     Effort: Pulmonary effort is normal. No respiratory distress.     Comments: Breathing comfortably at rest, CTABL, no wheezing, rales or other adventitious sounds auscultated Abdominal:     General: There is no distension.  Musculoskeletal:        General: Normal range of motion.     Cervical back: Neck supple.  Skin:    General: Skin is warm and dry.  Neurological:     Mental Status: She is alert and oriented to person, place, and time.      UC Treatments / Results  Labs (all labs ordered are listed, but only abnormal results are displayed) Labs Reviewed  NOVEL CORONAVIRUS, NAA   Narrative:    Performed at:  [address] [place], [place]  [id number] Lab Director: Rush Farmer MD, Phone:  [phone]  SARS-COV-2, NAA 2 DAY TAT   Narrative:    Performed at:  [facility] [address], [place], [place]  [id number] Lab Director: Rush Farmer MD, Phone:  [phone]    EKG   Radiology No results found.  Procedures Procedures (including critical care time)  Medications Ordered in UC Medications - No data to display  Initial Impression / Assessment and Plan / UC Course  I  have reviewed the triage vital signs and the nursing notes.  Pertinent labs & imaging results that were available during my care of the patient were reviewed by me and considered in my medical decision making (see chart for details).     Viral URI with cough- discussed with patient suspect symptoms likely viral and recommending continued symptomatic and supportive care, exam reassuring today and symptoms only x4 to 5 days, recommendations provided, adding in Tessalon and Phenergan DM for cough.  COVID test pending.  Through shared decision making did opt to go ahead and prescribe course of doxycycline to cover sinusitis if symptoms not improving despite additional time or symptoms worsening.  Discussed strict return precautions. Patient verbalized understanding and is agreeable with plan.  Final Clinical Impressions(s) / UC Diagnoses   Final diagnoses:  Viral URI with cough     Discharge Instructions     COVID test pending, monitor MyChart for results Rested fluids Continue Allegra, Mucinex Tessalon for daytime cough Phenergan DM for nighttime cough May fill prescription for doxycycline on Monday treat sinusitis if not seeing any improvement over the next 3 to 4 days    ED Prescriptions    Medication Sig Dispense Auth. Provider   doxycycline (VIBRAMYCIN) 100 MG capsule  (Status: Discontinued) Take 1 capsule (100 mg total) by mouth 2 (two) times daily for 7 days. 14 capsule Deadrick Stidd C, PA-C   benzonatate (TESSALON)  200 MG capsule Take 1 capsule (200 mg total) by mouth 3 (three) times daily as needed for up to 7 days for cough. 28 capsule Anahli Arvanitis C, PA-C   promethazine-dextromethorphan (PROMETHAZINE-DM) 6.25-15 MG/5ML syrup Take 5 mLs by mouth at bedtime as needed for cough. 118 mL Jabril Pursell C, PA-C   doxycycline (VIBRAMYCIN) 100 MG capsule Take 1 capsule (100 mg total) by mouth 2 (two) times daily for 7 days. 14 capsule Keiandre Cygan, [place] C, PA-C     PDMP not  reviewed this encounter.   Janith Lima, [place] 09/24/20 1546

## 2020-09-23 NOTE — ED Triage Notes (Signed)
Patient presents to Urgent Care with complaints of cough, nasal drainage since Monday. Treating symptoms with mucinex. Here to rule out sinus infection.   Denies fever, n/v, or diarrhea.

## 2020-09-23 NOTE — Discharge Instructions (Signed)
COVID test pending, monitor MyChart for results Rested fluids Continue Allegra, Mucinex Tessalon for daytime cough Phenergan DM for nighttime cough May fill prescription for doxycycline on Monday treat sinusitis if not seeing any improvement over the next 3 to 4 days

## 2020-09-24 LAB — SARS-COV-2, NAA 2 DAY TAT

## 2020-09-24 LAB — NOVEL CORONAVIRUS, NAA: SARS-CoV-2, NAA: NOT DETECTED

## 2020-12-01 ENCOUNTER — Other Ambulatory Visit: Payer: Self-pay | Admitting: Cardiology

## 2020-12-01 DIAGNOSIS — I10 Essential (primary) hypertension: Secondary | ICD-10-CM

## 2020-12-02 ENCOUNTER — Other Ambulatory Visit: Payer: Self-pay | Admitting: Cardiology

## 2020-12-02 DIAGNOSIS — I491 Atrial premature depolarization: Secondary | ICD-10-CM

## 2021-06-20 ENCOUNTER — Other Ambulatory Visit: Payer: Self-pay | Admitting: Obstetrics and Gynecology

## 2021-06-20 DIAGNOSIS — N6310 Unspecified lump in the right breast, unspecified quadrant: Secondary | ICD-10-CM

## 2021-07-08 ENCOUNTER — Ambulatory Visit
Admission: RE | Admit: 2021-07-08 | Discharge: 2021-07-08 | Disposition: A | Discharge status: Home / Self Care | Payer: BC Managed Care – PPO | Source: Ambulatory Visit | Attending: Obstetrics and Gynecology | Admitting: Obstetrics and Gynecology

## 2021-07-08 DIAGNOSIS — N6310 Unspecified lump in the right breast, unspecified quadrant: Secondary | ICD-10-CM

## 2021-09-10 ENCOUNTER — Ambulatory Visit
Admission: EM | Admit: 2021-09-10 | Discharge: 2021-09-10 | Disposition: A | Discharge status: Home / Self Care | Payer: BC Managed Care – PPO | Attending: Internal Medicine | Admitting: Internal Medicine

## 2021-09-10 DIAGNOSIS — R21 Rash and other nonspecific skin eruption: Secondary | ICD-10-CM

## 2021-09-10 DIAGNOSIS — L239 Allergic contact dermatitis, unspecified cause: Secondary | ICD-10-CM

## 2021-09-10 MED ORDER — TRIAMCINOLONE ACETONIDE 0.1 % EX CREA: 1.0000 "application " | TOPICAL_CREAM | Freq: Two times a day (BID) | CUTANEOUS | 0 refills | Status: DC

## 2021-09-10 NOTE — Discharge Instructions (Signed)
You have been prescribed a cream to apply to areas.  Please follow-up if symptoms persist or worsen.

## 2021-09-10 NOTE — ED Triage Notes (Signed)
Pt c/o pruritic rash x2 days not relieved with benadryl or hydrocortisone OTC. Rash has migrated to areas on the head. Denies dyspnea/SOB.

## 2021-09-10 NOTE — ED Provider Notes (Addendum)
[facility]    CSN: [id number] Arrival date & time: 09/10/21  5284      History   Chief Complaint Chief Complaint  Patient presents with   Rash    HPI Linda Castro is a 51 y.o. female.   Patient presents with itchy rash to chest, abdomen, neck that has been present for approximately 2 days.  Patient reports that the only change to her environment has been an oil that she applied to her body prior to rash starting.  Denies any associated fevers.  Patient has used hydrocortisone cream and taken Benadryl with minimal improvement of symptoms.  Patient also has an elevated blood pressure reading but reports that she has not taken her blood pressure medication this morning.  No complaints of chest pain, shortness of breath, headache, dizziness, blurred vision, nausea, vomiting.   Rash  Past Medical History:  Diagnosis Date   ANEMIA, IRON DEFICIENCY 09/08/2009   BREAST CYST, RIGHT 03/10/2010   Dysmenorrhea    Dyspareunia    FATIGUE 09/08/2009   Fibroadenoma of right breast    Fibroid    HYPERTENSION 09/08/2009   history, no longer on medications   MASTITIS 03/10/2010   SEIZURE DISORDER 09/08/2009   childhood history, family history of seizures   SYNCOPE 01/16/2010   Urinary incontinence     Patient Active Problem List   Diagnosis Date Noted   Cough 01/20/2016   Exertional dyspnea 01/20/2016   Snoring 01/20/2016   BREAST CYST, RIGHT 03/10/2010   MASTITIS 03/10/2010   SYNCOPE 01/16/2010   ANEMIA, IRON DEFICIENCY 09/08/2009   HYPERTENSION 09/08/2009   SEIZURE DISORDER 09/08/2009   FATIGUE 09/08/2009    Past Surgical History:  Procedure Laterality Date   ABDOMINAL HYSTERECTOMY Bilateral 07/01/2013   Procedure: TOTAL ABDOMINAL HYSTERECTOMY WITH BILATERAL SALPINGO OOPHORECTOMY;  Surgeon: Jamey Reas de Berton Lan, MD;  Location: [facility];  Service: Gynecology;  Laterality: Bilateral;   APPENDECTOMY  2006   BREAST SURGERY  2011    right--fibroadenoma removed   [place], 1993   x2   [place]    OB History     Gravida  3   Para  3   Term  3   Preterm      AB      Living  3      SAB      IAB      Ectopic      Multiple      Live Births               Home Medications    Prior to Admission medications   Medication Sig Start Date End Date Taking? Authorizing Provider  triamcinolone cream (KENALOG) 0.1 % Apply 1 application. topically 2 (two) times daily. 09/10/21  Yes Kenlei Safi, Hildred Alamin E, FNP  hydrochlorothiazide (HYDRODIURIL) 25 MG tablet Take 1 tablet (25 mg total) by mouth daily. 10/02/19 03/30/20  Tolia, Sunit, DO  Multiple Vitamins-Iron (MULTIVITAMINS WITH IRON) TABS tablet Take 1 tablet by mouth daily.    [provider]  omeprazole (PRILOSEC) 40 MG capsule Take 1 capsule (40 mg total) by mouth daily. 01/20/16   de Dios, [place] A, MD  promethazine-dextromethorphan (PROMETHAZINE-DM) 6.25-15 MG/5ML syrup Take 5 mLs by mouth at bedtime as needed for cough. 09/23/20   Wieters, Hallie C, PA-C  telmisartan (MICARDIS) 40 MG tablet TAKE 1 TABLET BY MOUTH EVERY DAY 11/21/19   Tolia, Sunit, DO  valsartan (DIOVAN) 160  MG tablet TAKE 1 TABLET (160 MG TOTAL) BY MOUTH EVERY EVENING. 12/01/20   Tolia, Sunit, DO  verapamil (CALAN-SR) 120 MG CR tablet TAKE 1 TABLET BY MOUTH EVERY DAY IN THE MORNING 12/03/20   Rex Kras, DO    Family History Family History  Problem Relation Age of Onset   Cancer Mother        peritoneal/?ovarian cells   Ovarian cancer Mother        ?   Hypertension Mother    Heart attack Father    Hypertension Father    Hyperlipidemia Father    Seizures Father        epilepsy   Diabetes Sister    Colon cancer Other        grandparent   Diabetes Other        parent, grandparent, other relative   Hypertension Other        parent, other relative   Stroke Other        parent, other relative   Colon cancer Maternal Grandmother     Social  History Social History   Tobacco Use   Smoking status: Never   Smokeless tobacco: Never   Tobacco comments:    Married, lives at home with spouse & 3 dtrs. 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 used  Substance Use Topics   Alcohol use: Yes    Alcohol/week: 1.0 standard drink    Types: 1 Standard drinks or equivalent per week    Comment: sometimes   Drug use: No     Allergies   Hydrocodone, Oxycodone, Amoxicillin, Codeine, and Lisinopril   Review of Systems Review of Systems Per HPI  Physical Exam Triage Vital Signs ED Triage Vitals  Enc Vitals Group     BP 09/10/21 0843 (!) 157/106     Pulse Rate 09/10/21 0842 72     Resp 09/10/21 0842 18     Temp 09/10/21 0842 98.2 F (36.8 C)     Temp Source 09/10/21 0842 Oral     SpO2 09/10/21 0842 98 %     Weight --      Height --      Head Circumference --      Peak Flow --      Pain Score 09/10/21 0842 0     Pain Loc --      Pain Edu? --      Excl. in [place]? --    No data found.  Updated Vital Signs BP (!) 157/106 (BP Location: Left Arm)   Pulse 72   Temp 98.2 F (36.8 C) (Oral)   Resp 18   LMP 06/06/2013   SpO2 98%   Visual Acuity Right Eye Distance:   Left Eye Distance:   Bilateral Distance:    Right Eye Near:   Left Eye Near:    Bilateral Near:     Physical Exam Constitutional:      General: She is not in acute distress.    Appearance: Normal appearance. She is not toxic-appearing or diaphoretic.  HENT:     Head: Normocephalic and atraumatic.  Eyes:     Extraocular Movements: Extraocular movements intact.     Conjunctiva/sclera: Conjunctivae normal.     Pupils: Pupils are equal, round, and reactive to light.  Cardiovascular:     Rate and Rhythm: Normal rate and regular rhythm.     Pulses: Normal pulses.     Heart sounds: Normal  heart sounds.  Pulmonary:     Effort: Pulmonary effort is normal. No respiratory distress.     Breath sounds: Normal breath  sounds.  Skin:    Comments: Very minimal maculopapular rash present to right upper abdomen, chest, left lateral neck.  No purulent drainage noted.  Neurological:     General: No focal deficit present.     Mental Status: She is alert and oriented to person, place, and time. Mental status is at baseline.     Cranial Nerves: Cranial nerves 2-12 are intact.     Sensory: Sensation is intact.     Motor: Motor function is intact.     Coordination: Coordination is intact.     Gait: Gait is intact.  Psychiatric:        Mood and Affect: Mood normal.        Behavior: Behavior normal.        Thought Content: Thought content normal.        Judgment: Judgment normal.     UC Treatments / Results  Labs (all labs ordered are listed, but only abnormal results are displayed) Labs Reviewed - No data to display  EKG   Radiology No results found.  Procedures Procedures (including critical care time)  Medications Ordered in UC Medications - No data to display  Initial Impression / Assessment and Plan / UC Course  I have reviewed the triage vital signs and the nursing notes.  Pertinent labs & imaging results that were available during my care of the patient were reviewed by me and considered in my medical decision making (see chart for details).     Rash is very minimal and not diffuse.  Appears allergic in nature.  Suspect new cosmetic product could be because of rash and the patient was advised to avoid use.  Prescribed triamcinolone cream to apply to areas.  Patient advised to avoid applying to behind the ears as this is a sensitive skin area.  Discussed supportive care with patient.  Has elevated blood pressure reading but reports that she has not taken her blood pressure medication today.  Advised patient to restart her blood pressure medication at previously prescribed dose as soon as possible given elevated blood pressure reading today.  No signs of hypertensive urgency at this time.   Discussed return precautions.  Patient verbalized understanding and was agreeable with plan. Final Clinical Impressions(s) / UC Diagnoses   Final diagnoses:  Rash and nonspecific skin eruption  Allergic contact dermatitis, unspecified trigger     Discharge Instructions      You have been prescribed a cream to apply to areas.  Please follow-up if symptoms persist or worsen.    ED Prescriptions     Medication Sig Dispense Auth. Provider   triamcinolone cream (KENALOG) 0.1 % Apply 1 application. topically 2 (two) times daily. 30 g Teodora Medici, [place]      PDMP not reviewed this encounter.   Teodora Medici, [place] 09/10/21 [place], [place], FNP 09/10/21 1000

## 2021-09-28 ENCOUNTER — Other Ambulatory Visit: Payer: Self-pay | Admitting: Cardiology

## 2021-09-28 DIAGNOSIS — I491 Atrial premature depolarization: Secondary | ICD-10-CM

## 2021-11-10 ENCOUNTER — Emergency Department (HOSPITAL_COMMUNITY)
Admission: EM | Admit: 2021-11-10 | Discharge: 2021-11-11 | Discharge status: Against Medical Advice | Payer: BC Managed Care – PPO | Attending: Emergency Medicine | Admitting: Emergency Medicine

## 2021-11-10 ENCOUNTER — Encounter (HOSPITAL_COMMUNITY): Payer: Self-pay | Admitting: Emergency Medicine

## 2021-11-10 ENCOUNTER — Emergency Department (HOSPITAL_COMMUNITY): Payer: BC Managed Care – PPO

## 2021-11-10 DIAGNOSIS — R200 Anesthesia of skin: Secondary | ICD-10-CM | POA: Insufficient documentation

## 2021-11-10 DIAGNOSIS — I10 Essential (primary) hypertension: Secondary | ICD-10-CM | POA: Insufficient documentation

## 2021-11-10 DIAGNOSIS — Z20822 Contact with and (suspected) exposure to covid-19: Secondary | ICD-10-CM | POA: Insufficient documentation

## 2021-11-10 DIAGNOSIS — Z5321 Procedure and treatment not carried out due to patient leaving prior to being seen by health care provider: Secondary | ICD-10-CM | POA: Diagnosis not present

## 2021-11-10 LAB — CBC WITH DIFFERENTIAL/PLATELET
Abs Immature Granulocytes: 0.01 10*3/uL (ref 0.00–0.07)
Basophils Absolute: 0 10*3/uL (ref 0.0–0.1)
Basophils Relative: 1 %
Eosinophils Absolute: 0.1 10*3/uL (ref 0.0–0.5)
Eosinophils Relative: 1 %
HCT: 39.9 % (ref 36.0–46.0)
Hemoglobin: 13 g/dL (ref 12.0–15.0)
Immature Granulocytes: 0 %
Lymphocytes Relative: 54 %
Lymphs Abs: 3.9 10*3/uL (ref 0.7–4.0)
MCH: 27 pg (ref 26.0–34.0)
MCHC: 32.6 g/dL (ref 30.0–36.0)
MCV: 82.8 fL (ref 80.0–100.0)
Monocytes Absolute: 0.5 10*3/uL (ref 0.1–1.0)
Monocytes Relative: 7 %
Neutro Abs: 2.6 10*3/uL (ref 1.7–7.7)
Neutrophils Relative %: 37 %
Platelets: 231 10*3/uL (ref 150–400)
RBC: 4.82 MIL/uL (ref 3.87–5.11)
RDW: 14 % (ref 11.5–15.5)
WBC: 7 10*3/uL (ref 4.0–10.5)
nRBC: 0 % (ref 0.0–0.2)

## 2021-11-10 LAB — URINALYSIS, ROUTINE W REFLEX MICROSCOPIC
Bacteria, UA: NONE SEEN
Bilirubin Urine: NEGATIVE
Glucose, UA: NEGATIVE mg/dL
Hgb urine dipstick: NEGATIVE
Ketones, ur: NEGATIVE mg/dL
Leukocytes,Ua: NEGATIVE
Nitrite: NEGATIVE
Protein, ur: NEGATIVE mg/dL
Specific Gravity, Urine: 1.006 (ref 1.005–1.030)
pH: 7 (ref 5.0–8.0)

## 2021-11-10 LAB — I-STAT CHEM 8, ED
BUN: 12 mg/dL (ref 6–20)
Calcium, Ion: 1.17 mmol/L (ref 1.15–1.40)
Chloride: 102 mmol/L (ref 98–111)
Creatinine, Ser: 0.8 mg/dL (ref 0.44–1.00)
Glucose, Bld: 87 mg/dL (ref 70–99)
HCT: 43 % (ref 36.0–46.0)
Hemoglobin: 14.6 g/dL (ref 12.0–15.0)
Potassium: 3.4 mmol/L — ABNORMAL LOW (ref 3.5–5.1)
Sodium: 141 mmol/L (ref 135–145)
TCO2: 28 mmol/L (ref 22–32)

## 2021-11-10 LAB — COMPREHENSIVE METABOLIC PANEL
ALT: 18 U/L (ref 0–44)
AST: 30 U/L (ref 15–41)
Albumin: 4.6 g/dL (ref 3.5–5.0)
Alkaline Phosphatase: 38 U/L (ref 38–126)
Anion gap: 8 (ref 5–15)
BUN: 13 mg/dL (ref 6–20)
CO2: 28 mmol/L (ref 22–32)
Calcium: 9.5 mg/dL (ref 8.9–10.3)
Chloride: 107 mmol/L (ref 98–111)
Creatinine, Ser: 0.72 mg/dL (ref 0.44–1.00)
GFR, Estimated: >60 mL/min (ref >60)
Glucose, Bld: 91 mg/dL (ref 70–99)
Potassium: 3.6 mmol/L (ref 3.5–5.1)
Sodium: 143 mmol/L (ref 135–145)
Total Bilirubin: 0.6 mg/dL (ref 0.3–1.2)
Total Protein: 8 g/dL (ref 6.5–8.1)

## 2021-11-10 LAB — RESP PANEL BY RT-PCR (FLU A&B, COVID) ARPGX2
Influenza A by PCR: NEGATIVE
Influenza B by PCR: NEGATIVE
SARS Coronavirus 2 by RT PCR: NEGATIVE

## 2021-11-10 LAB — RAPID URINE DRUG SCREEN, HOSP PERFORMED
Amphetamines: NOT DETECTED
Barbiturates: NOT DETECTED
Benzodiazepines: NOT DETECTED
Cocaine: NOT DETECTED
Opiates: NOT DETECTED
Tetrahydrocannabinol: NOT DETECTED

## 2021-11-10 LAB — I-STAT BETA HCG BLOOD, ED (MC, WL, AP ONLY): I-stat hCG, quantitative: <5 m[IU]/mL (ref <5)

## 2021-11-10 LAB — PROTIME-INR
INR: 0.9 (ref 0.8–1.2)
Prothrombin Time: 12.3 seconds (ref 11.4–15.2)

## 2021-11-10 LAB — ETHANOL: Alcohol, Ethyl (B): <10 mg/dL (ref <10)

## 2021-11-10 LAB — APTT: aPTT: 29 seconds (ref 24–36)

## 2021-11-10 NOTE — ED Provider Triage Note (Signed)
Emergency Medicine Provider Triage Evaluation Note  Linda Castro , a 51 y.o. female  was evaluated in triage.  Pt complains of new onset neurodeficits and hypertension.  At 8 AM, tongue became numb, described as if she'd just gotten some dental work done.  Then later in the day noticed her left leg felt weaker than her right.  Checked her blood pressure, which was about 170/110, which is higher than her normal of about 145/90.  Denies vision changes, active headache, shortness of breath, chest pain, neck stiffness, abdominal pain.  No changes in urinary or bowel habits.  No history of lumbar radiculopathy.  Review of Systems  Positive:  Negative: As above  Physical Exam  BP (!) 174/110 (BP Location: Left Arm)   Pulse 74   Temp 99 F (37.2 C) (Oral)   Resp 16   LMP 06/06/2013   SpO2 100%  Gen:   Awake, no distress   Resp:  Normal effort  MSK:   Moves extremities without difficulty  Other:  Left lower extremity weakness, decreased tongue sensation.  Normal EOMs.  PERRLA.  No pronator drift, aphasia, or abnormal gait.  Coordination and sensation appears grossly intact.  Abdomen soft nontender.  V1, V2, V3 appear intact.  Facial nerve appears unaffected.  No facial asymmetry.  No tongue deviation.  Medical Decision Making  Medically screening exam initiated at 7:22 PM.  Appropriate orders placed.  Linda Castro was informed that the remainder of the evaluation will be completed by another provider, this initial triage assessment does not replace that evaluation, and the importance of remaining in the ED until their evaluation is complete.  Discussed patient with attending, initiate stroke work-up without activation of code stroke due to being outside the window.   Prince Rome, [place] 67/89/38 [id number]

## 2021-11-10 NOTE — ED Triage Notes (Signed)
Patient here from home reporting mouth/tongue numbness that started 1 hour ago, states that was hypertensive at home. BP meds with no relief.

## 2021-11-10 NOTE — ED Notes (Addendum)
Patient transported to CT 

## 2021-11-17 ENCOUNTER — Other Ambulatory Visit: Payer: Self-pay | Admitting: Internal Medicine

## 2021-11-17 DIAGNOSIS — R432 Parageusia: Secondary | ICD-10-CM

## 2021-11-17 DIAGNOSIS — R110 Nausea: Secondary | ICD-10-CM

## 2021-11-23 ENCOUNTER — Ambulatory Visit
Admission: RE | Admit: 2021-11-23 | Discharge: 2021-11-23 | Disposition: A | Discharge status: Home / Self Care | Payer: BC Managed Care – PPO | Source: Ambulatory Visit | Attending: Internal Medicine | Admitting: Internal Medicine

## 2021-11-23 DIAGNOSIS — R432 Parageusia: Secondary | ICD-10-CM

## 2021-11-23 DIAGNOSIS — R110 Nausea: Secondary | ICD-10-CM

## 2021-11-23 MED ORDER — GADOBENATE DIMEGLUMINE 529 MG/ML IV SOLN
15.0000 mL | Freq: Once | INTRAVENOUS | Status: AC | PRN
  Administered 2021-11-23: 15 mL via INTRAVENOUS

## 2021-12-01 ENCOUNTER — Encounter: Payer: Self-pay | Admitting: *Deleted

## 2021-12-01 ENCOUNTER — Ambulatory Visit: Payer: BC Managed Care – PPO | Admitting: Diagnostic Neuroimaging

## 2021-12-01 VITALS — BP 164/102 | HR 72 | Ht 67.0 in | Wt 178.2 lb

## 2021-12-01 DIAGNOSIS — R531 Weakness: Secondary | ICD-10-CM | POA: Diagnosis not present

## 2021-12-01 DIAGNOSIS — R200 Anesthesia of skin: Secondary | ICD-10-CM | POA: Diagnosis not present

## 2021-12-01 DIAGNOSIS — I6381 Other cerebral infarction due to occlusion or stenosis of small artery: Secondary | ICD-10-CM

## 2021-12-01 NOTE — Progress Notes (Signed)
[facility]  PATIENT: Linda Castro DOB: 1971/03/04  REFERRING CLINICIAN: Merrilee Seashore, MD HISTORY FROM: patient  REASON FOR VISIT: new consult    HISTORICAL  CHIEF COMPLAINT:  Chief Complaint  Patient presents with   Cerebral infarction    Rm 7 New Pt  husband- Shaun  "here because of high BP and the MRI results"    HISTORY OF PRESENT ILLNESS:   51 year old female here for evaluation of numbness, abnormal MRI brain.  11/10/2021 patient woke up with numbness on her tongue and bilateral lower lip.  Her taste also felt off.  She had some heaviness on her left arm and left leg.  Patient went to the emergency room for evaluation.  She had CT scan head which were unremarkable.  Due to prolonged wait she ended up going home for complete evaluation.  She went to PCP within a few days and then had MRI of the brain with and without contrast on 11/23/2021.  This demonstrated a possible subacute stroke in the right thalamus.  Subtle lesion noted in the right internal auditory canal, vascular enhancement versus schwannoma.  Possibility of intracranial metastasis was also raised.  Since that time patient's symptoms have slightly improved.  Left-sided symptoms have resolved.  Numbness on the tongue and lip have greatly improved.  She still feels abnormal taste sensation.  Patient does have hypertension, which has been higher in the last few months.  She is on medication.  Since MRI results have returned the patient is now on aspirin 81 mg a day and rosuvastatin.    REVIEW OF SYSTEMS: Full 14 system review of systems performed and negative with exception of: as per HPI.  ALLERGIES: Allergies  Allergen Reactions   Hydrocodone Nausea And Vomiting   Oxycodone Nausea And Vomiting   Oxycodone-Acetaminophen     Other reaction(s): nauseous   Amoxicillin     GI upset   Codeine Nausea And Vomiting   Lisinopril Hives and Rash    Tingling sensation in face.      HOME MEDICATIONS: Outpatient Medications Prior to Visit  Medication Sig Dispense Refill   aspirin EC 81 MG tablet Take 81 mg by mouth daily. Swallow whole.     Multiple Vitamins-Iron (MULTIVITAMINS WITH IRON) TABS tablet Take 1 tablet by mouth daily.     omeprazole (PRILOSEC) 40 MG capsule Take 1 capsule (40 mg total) by mouth daily. 30 capsule 3   rosuvastatin (CRESTOR) 10 MG tablet Take 10 mg by mouth at bedtime.     telmisartan (MICARDIS) 40 MG tablet TAKE 1 TABLET BY MOUTH EVERY DAY 90 tablet 1   verapamil (CALAN-SR) 120 MG CR tablet TAKE 1 TABLET BY MOUTH EVERY DAY IN THE MORNING (Patient taking differently: Take 120 mg by mouth daily.) 90 tablet 1   hydrochlorothiazide (HYDRODIURIL) 25 MG tablet Take 1 tablet (25 mg total) by mouth daily. 90 tablet 1   promethazine-dextromethorphan (PROMETHAZINE-DM) 6.25-15 MG/5ML syrup Take 5 mLs by mouth at bedtime as needed for cough. 118 mL 0   triamcinolone cream (KENALOG) 0.1 % Apply 1 application. topically 2 (two) times daily. (Patient not taking: Reported on 12/01/2021) 30 g 0   valsartan (DIOVAN) 160 MG tablet TAKE 1 TABLET (160 MG TOTAL) BY MOUTH EVERY EVENING. (Patient not taking: Reported on 12/01/2021) 90 tablet 1   No facility-administered medications prior to visit.    PAST MEDICAL HISTORY: Past Medical History:  Diagnosis Date   ANEMIA, IRON DEFICIENCY 09/08/2009   BREAST CYST, RIGHT  03/10/2010   Dysmenorrhea    Dyspareunia    FATIGUE 09/08/2009   Fibroadenoma of right breast    Fibroid    GERD (gastroesophageal reflux disease)    Hypercholesterolemia    HYPERTENSION 09/08/2009   history, no longer on medications   MASTITIS 03/10/2010   Other cerebral infarction ([place])    SEIZURE DISORDER 09/08/2009   childhood history, family history of seizures   SYNCOPE 01/16/2010   Urinary incontinence     PAST SURGICAL HISTORY: Past Surgical History:  Procedure Laterality Date   ABDOMINAL HYSTERECTOMY Bilateral 07/01/2013    Procedure: TOTAL ABDOMINAL HYSTERECTOMY WITH BILATERAL SALPINGO OOPHORECTOMY;  Surgeon: Jamey Reas de Berton Lan, MD;  Location: [facility];  Service: Gynecology;  Laterality: Bilateral;   APPENDECTOMY  2006   BREAST SURGERY  2011   right--fibroadenoma removed   [place]   x2   TUBAL LIGATION  1996    FAMILY HISTORY: Family History  Problem Relation Age of Onset   Diabetes Mother    Cancer Mother        peritoneal/?ovarian cells   Ovarian cancer Mother        ?   Hypertension Mother    Stroke Father    Diabetes Father    Heart attack Father    Hypertension Father    Hyperlipidemia Father    Seizures Father        epilepsy   Rheum arthritis Father    Diabetes Sister    Colon cancer Maternal Grandmother    Colon cancer Other        grandparent   Diabetes Other        parent, grandparent, other relative   Hypertension Other        parent, other relative   Stroke Other        parent, other relative    SOCIAL HISTORY: Social History   Socioeconomic History   Marital status: Married    Spouse name: Shaun   Number of children: 3   Years of education: 12   Highest education level: Not on file  Occupational History   Occupation: Admin Assitant  Tobacco Use   Smoking status: Never   Smokeless tobacco: Never   Tobacco comments:    Married, lives at home with spouse & 3 dtrs. 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 used  Substance and Sexual Activity   Alcohol use: Yes    Alcohol/week: 1.0 standard drink of alcohol    Types: 1 Standard drinks or equivalent per week    Comment: sometimes   Drug use: No   Sexual activity: Yes    Partners: Male    Birth control/protection: Surgical    Comment: Tubal/TAH/BSO  Other Topics Concern   Not on file  Social History Narrative   Lives with spouse   Caffeine use: 1-2 cups coffee per day   Right handed    Social Determinants of Health   Financial  Resource Strain: Not on file  Food Insecurity: Not on file  Transportation Needs: Not on file  Physical Activity: Not on file  Stress: Not on file  Social Connections: Not on file  Intimate Partner Violence: Not on file     PHYSICAL EXAM  GENERAL EXAM/CONSTITUTIONAL: Vitals:  Vitals:   12/01/21 1313  BP: (!) 164/102  Pulse: 72  Weight: 178 lb 3.2 oz (80.8 kg)  Height: '5\' 7"'$  (1.702 m)  Body mass index is 27.91 kg/m. Wt Readings from Last 3 Encounters:  12/01/21 178 lb 3.2 oz (80.8 kg)  11/21/19 180 lb 9.6 oz (81.9 kg)  10/02/19 182 lb (82.6 kg)   Patient is in no distress; well developed, nourished and groomed; neck is supple  CARDIOVASCULAR: Examination of carotid arteries is normal; no carotid bruits Regular rate and rhythm, no murmurs Examination of peripheral vascular system by observation and palpation is normal  EYES: Ophthalmoscopic exam of optic discs and posterior segments is normal; no papilledema or hemorrhages No results found.  MUSCULOSKELETAL: Gait, strength, tone, movements noted in Neurologic exam below  NEUROLOGIC: MENTAL STATUS:      No data to display         awake, alert, oriented to person, place and time recent and remote memory intact normal attention and concentration language fluent, comprehension intact, naming intact fund of knowledge appropriate  CRANIAL NERVE:  2nd - no papilledema on fundoscopic exam 2nd, 3rd, 4th, 6th - pupils equal and reactive to light, visual fields full to confrontation, extraocular muscles intact, no nystagmus 5th - facial sensation symmetric 7th - facial strength symmetric 8th - hearing intact 9th - palate elevates symmetrically, uvula midline 11th - shoulder shrug symmetric 12th - tongue protrusion midline  MOTOR:  normal bulk and tone, full strength in the BUE, BLE RIGHT ARM SLIGHTLY ORBITS AROUND LEFT  SENSORY:  normal and symmetric to light touch, pinprick, temperature,  vibration  COORDINATION:  finger-nose-finger, fine finger movements normal  REFLEXES:  deep tendon reflexes present and symmetric  GAIT/STATION:  narrow based gait     DIAGNOSTIC DATA (LABS, IMAGING, TESTING) - I reviewed patient records, labs, notes, testing and imaging myself where available.  Lab Results  Component Value Date   WBC 7.0 11/10/2021   HGB 14.6 11/10/2021   HCT 43.0 11/10/2021   MCV 82.8 11/10/2021   PLT 231 11/10/2021      Component Value Date/Time   NA 141 11/10/2021 1952   NA 143 12/01/2019 0833   K 3.4 (L) 11/10/2021 1952   CL 102 11/10/2021 1952   CO2 28 11/10/2021 1930   GLUCOSE 87 11/10/2021 1952   BUN 12 11/10/2021 1952   BUN 13 12/01/2019 0833   CREATININE 0.80 11/10/2021 1952   CREATININE 0.69 04/15/2013 0836   CALCIUM 9.5 11/10/2021 1930   PROT 8.0 11/10/2021 1930   PROT 7.4 08/22/2019 0830   ALBUMIN 4.6 11/10/2021 1930   ALBUMIN 4.9 08/22/2019 0830   AST 30 11/10/2021 1930   ALT 18 11/10/2021 1930   ALKPHOS 38 11/10/2021 1930   BILITOT 0.6 11/10/2021 1930   BILITOT 0.5 08/22/2019 0830   GFRNONAA >60 11/10/2021 1930   GFRAA 97 12/01/2019 0833   Lab Results  Component Value Date   CHOL 201 (H) 10/10/2019   HDL 59 10/10/2019   LDLCALC 122 (H) 10/10/2019   TRIG 113 10/10/2019   CHOLHDL 3.4 01/16/2019   No results found for: "HGBA1C" Lab Results  Component Value Date   VITAMINB12 255 09/08/2009   Lab Results  Component Value Date   TSH 1.016 04/15/2013    07/05/16 MRI brain (with and without) [I reviewed images myself and agree with interpretation. -VRP]  Negative exam.  No explanation for symptoms.  11/23/21 MRI brain (with and without) [I reviewed images myself and agree with interpretation. -VRP]  Indeterminate 5 mm focus of abnormal enhancement within the right thalamus. While this may reflect a late subacute infarct, alternative etiologies (such as  an isolated intracranial metastasis) cannot be excluded. A  short-interval 2-3 month follow-up brain MRI without and with contrast is recommended to monitor this finding.   2 mm focus of asymmetrically prominent enhancement within the right internal auditory canal. This may reflect asymmetric vascular enhancement or a vestibular schwannoma. An internal auditory canal protocol is recommended at time of MRI follow-up to better assess this finding.   Mild multifocal T2 FLAIR hyperintense signal abnormality within the cerebral white matter, nonspecific but most often secondary to chronic small vessel ischemia.   Partially empty sella turcica. While this finding often reflects incidental anatomic variation, it can also be associated with idiopathic intracranial hypertension (pseudotumor cerebri).      ASSESSMENT AND PLAN  51 y.o. year old female here with:   Dx:  1. Numbness   2. Left-sided weakness   3. 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[place])       PLAN:  RIGHT THALAMIC LESION (likely small vessel thrombosis and lacunar infarction; CNS inflamm, autoimmune, neoplasm less likely) - continue aspirin, statin, BP control - check MRI brain / IAC (w/wo), MRA head / neck (setup at [facility]; same scanner if possible for accurate comparison) - check TTE - check sleep study -If symptoms worsen or fail to improve, then may consider CT chest, abd, pelvis, lumbar puncture in future  RIGHT INTERNAL AUDITORY CANAL LESION (asymptomatic) - likely mild asymmetric vascular enhancement; will follow up IAC protocol MRI  Orders Placed This Encounter  Procedures   MR BRAIN W WO CONTRAST   MR ANGIO HEAD WO CONTRAST   MR ANGIO NECK W WO CONTRAST   Ambulatory referral to Sleep Studies   ECHOCARDIOGRAM COMPLETE BUBBLE STUDY   Return in about 3 months (around 03/03/2022) for pending test results, pending if symptoms worsen or fail to improve.  I spent 63 minutes of face-to-face and non-face-to-face time with patient.  This included previsit chart  review, lab review, study review, order entry, electronic health record documentation, patient education.     Penni Bombard, MD 2/33/0076, 2:26 PM Certified in Neurology, Neurophysiology and Neuroimaging  [facility] [address] [place], [place] [address] [phone]

## 2021-12-01 NOTE — Patient Instructions (Addendum)
  RIGHT THALAMIC LESION (possible stroke; CNS inflamm, autoimmune, neoplasm less likely)  - continue aspirin, statin, BP control  - check MRI brain / IAC (w/wo), MRA head / neck (setup at [facility]; same scanner if possible for accurate comparison)  - check TTE (echocardiogram)  - check sleep study  - may consider CT chest, abd, pelvis, lumbar puncture in future

## 2021-12-08 ENCOUNTER — Telehealth: Payer: Self-pay | Admitting: Diagnostic Neuroimaging

## 2021-12-08 NOTE — Telephone Encounter (Signed)
Dillon Bjork: [id number] exp. 12/08/2021 - 01/06/2022 patient requested GI

## 2021-12-16 ENCOUNTER — Other Ambulatory Visit: Payer: Self-pay | Admitting: Diagnostic Neuroimaging

## 2021-12-16 ENCOUNTER — Ambulatory Visit
Admission: RE | Admit: 2021-12-16 | Discharge: 2021-12-16 | Disposition: A | Discharge status: Home / Self Care | Payer: BC Managed Care – PPO | Source: Ambulatory Visit | Attending: Diagnostic Neuroimaging | Admitting: Diagnostic Neuroimaging

## 2021-12-16 DIAGNOSIS — I6381 Other cerebral infarction due to occlusion or stenosis of small artery: Secondary | ICD-10-CM

## 2021-12-16 DIAGNOSIS — R531 Weakness: Secondary | ICD-10-CM

## 2021-12-16 DIAGNOSIS — R200 Anesthesia of skin: Secondary | ICD-10-CM | POA: Diagnosis not present

## 2021-12-16 MED ORDER — GADOBENATE DIMEGLUMINE 529 MG/ML IV SOLN
15.0000 mL | Freq: Once | INTRAVENOUS | Status: AC | PRN
Start: 2021-12-16 — End: 2021-12-16
  Administered 2021-12-16: 15 mL via INTRAVENOUS

## 2021-12-19 ENCOUNTER — Telehealth: Payer: Self-pay | Admitting: Diagnostic Neuroimaging

## 2021-12-19 NOTE — Telephone Encounter (Signed)
[employer] Josem Kaufmann: [id number] (exp. 12/08/21 to 01/06/22) order sent to GI, they will reach out to the patient to schedule.

## 2021-12-20 ENCOUNTER — Ambulatory Visit (HOSPITAL_COMMUNITY): Payer: BC Managed Care – PPO | Attending: Cardiovascular Disease

## 2021-12-20 DIAGNOSIS — R200 Anesthesia of skin: Secondary | ICD-10-CM | POA: Diagnosis present

## 2021-12-20 DIAGNOSIS — I6381 Other cerebral infarction due to occlusion or stenosis of small artery: Secondary | ICD-10-CM | POA: Insufficient documentation

## 2021-12-20 DIAGNOSIS — R531 Weakness: Secondary | ICD-10-CM | POA: Diagnosis not present

## 2021-12-20 LAB — ECHOCARDIOGRAM COMPLETE BUBBLE STUDY
Area-P 1/2: 3.65 cm2
S' Lateral: 3.3 cm

## 2021-12-20 MED ORDER — SODIUM CHLORIDE 0.9% FLUSH
10.0000 mL | INTRAVENOUS | Status: AC | PRN
  Administered 2021-12-20: 20 mL via INTRAVENOUS

## 2021-12-20 MED ORDER — PERFLUTREN LIPID MICROSPHERE: 1.0000 mL | INTRAVENOUS | Status: DC | PRN

## 2021-12-29 ENCOUNTER — Telehealth: Payer: Self-pay

## 2021-12-29 NOTE — Telephone Encounter (Signed)
-----   Message from Penni Bombard, MD sent at 12/28/2021  6:24 PM EDT ----- Unremarkable study. No major findings. Please call patient. Continue current plan. -VRP

## 2021-12-29 NOTE — Telephone Encounter (Signed)
Called pt and discussed results. She understood and appreciated the call.

## 2022-01-16 ENCOUNTER — Telehealth: Payer: Self-pay | Admitting: Oncology

## 2022-01-16 NOTE — Telephone Encounter (Signed)
Scheduled appt per 9/22 referral. Pt is aware of appt date and time. Pt is aware to arrive 15 mins prior to appt time and to bring and updated insurance card. Pt is aware of appt location.   

## 2022-02-03 ENCOUNTER — Inpatient Hospital Stay: Payer: BC Managed Care – PPO | Attending: Oncology | Admitting: Oncology

## 2022-02-03 VITALS — BP 132/82 | HR 84 | Temp 97.9°F | Resp 17 | Ht 67.0 in | Wt 178.1 lb

## 2022-02-03 DIAGNOSIS — Z800 Family history of malignant neoplasm of digestive organs: Secondary | ICD-10-CM | POA: Diagnosis not present

## 2022-02-03 DIAGNOSIS — D6859 Other primary thrombophilia: Secondary | ICD-10-CM | POA: Insufficient documentation

## 2022-02-03 DIAGNOSIS — Z90710 Acquired absence of both cervix and uterus: Secondary | ICD-10-CM | POA: Insufficient documentation

## 2022-02-03 DIAGNOSIS — I639 Cerebral infarction, unspecified: Secondary | ICD-10-CM

## 2022-02-03 DIAGNOSIS — I10 Essential (primary) hypertension: Secondary | ICD-10-CM | POA: Insufficient documentation

## 2022-02-03 DIAGNOSIS — Z8673 Personal history of transient ischemic attack (TIA), and cerebral infarction without residual deficits: Secondary | ICD-10-CM | POA: Diagnosis not present

## 2022-02-03 DIAGNOSIS — Z8041 Family history of malignant neoplasm of ovary: Secondary | ICD-10-CM | POA: Insufficient documentation

## 2022-02-03 NOTE — Progress Notes (Signed)
Reason for the request:    Cerebral infarction  HPI: I was asked by Dr. Ashby Dawes to evaluate Linda Castro for the evaluation of thromboembolism.  She is a 51 year old woman who was found to have low protein S level back in 2019 with free protein S of less than 25.  She has history of migraine and was not on any anticoagulation.  She presented to the emergency department in July 2023 with numbness in her tongue and bilateral lips and underwent MRI on August 2 which showed a subacute stroke in the right thalamus and subtle lesion in the right internal auditory canal, vascular enhancement versus schwannoma.  Her symptoms subsequently improved and was evaluated by neurology and was started on aspirin.  She does have show family history of protein S deficiency with family members been on Xarelto previously.  Clinically, she reports improvement in her symptoms and near resolution.  She also is reported that her blood pressure was running high leading up to this episode and has improved at this time.   She does not report any headaches, blurry vision, syncope or seizures. Does not report any fevers, chills or sweats.  Does not report any cough, wheezing or hemoptysis.  Does not report any chest pain, palpitation, orthopnea or leg edema.  Does not report any nausea, vomiting or abdominal pain.  Does not report any constipation or diarrhea.  Does not report any skeletal complaints.    Does not report frequency, urgency or hematuria.  Does not report any skin rashes or lesions. Does not report any heat or cold intolerance.  Does not report any lymphadenopathy or petechiae.  Does not report any anxiety or depression.  Remaining review of systems is negative.     Past Medical History:  Diagnosis Date   ANEMIA, IRON DEFICIENCY 09/08/2009   BREAST CYST, RIGHT 03/10/2010   Dysmenorrhea    Dyspareunia    FATIGUE 09/08/2009   Fibroadenoma of right breast    Fibroid    GERD (gastroesophageal reflux disease)     Hypercholesterolemia    HYPERTENSION 09/08/2009   history, no longer on medications   MASTITIS 03/10/2010   Other cerebral infarction ([place])    SEIZURE DISORDER 09/08/2009   childhood history, family history of seizures   SYNCOPE 01/16/2010   Urinary incontinence   :   Past Surgical History:  Procedure Laterality Date   ABDOMINAL HYSTERECTOMY Bilateral 07/01/2013   Procedure: TOTAL ABDOMINAL HYSTERECTOMY WITH BILATERAL SALPINGO OOPHORECTOMY;  Surgeon: Jamey Reas de Berton Lan, MD;  Location: [facility];  Service: Gynecology;  Laterality: Bilateral;   APPENDECTOMY  2006   BREAST SURGERY  2011   right--fibroadenoma removed   Shields   x2   TUBAL LIGATION  1996  :   Current Outpatient Medications:    aspirin EC 81 MG tablet, Take 81 mg by mouth daily. Swallow whole., Disp: , Rfl:    Multiple Vitamins-Iron (MULTIVITAMINS WITH IRON) TABS tablet, Take 1 tablet by mouth daily., Disp: , Rfl:    omeprazole (PRILOSEC) 40 MG capsule, Take 1 capsule (40 mg total) by mouth daily., Disp: 30 capsule, Rfl: 3   rosuvastatin (CRESTOR) 10 MG tablet, Take 10 mg by mouth at bedtime., Disp: , Rfl:    telmisartan (MICARDIS) 40 MG tablet, TAKE 1 TABLET BY MOUTH EVERY DAY, Disp: 90 tablet, Rfl: 1   verapamil (CALAN-SR) 120 MG CR tablet, TAKE 1 TABLET BY MOUTH EVERY DAY IN THE MORNING (Patient taking differently: Take 120  mg by mouth daily.), Disp: 90 tablet, Rfl: 1 No current facility-administered medications for this visit.  Facility-Administered Medications Ordered in Other Visits:    sodium chloride flush (NS) 0.9 % injection 10 mL, 10 mL, Intravenous, PRN, Penumalli, Vikram R, MD, 20 mL at 12/20/21 0825:   Allergies  Allergen Reactions   Hydrocodone Nausea And Vomiting   Oxycodone Nausea And Vomiting   Oxycodone-Acetaminophen     Other reaction(s): nauseous   Amoxicillin     GI upset   Codeine Nausea And Vomiting   Lisinopril Hives and Rash    Tingling sensation  in face.   :   Family History  Problem Relation Age of Onset   Diabetes Mother    Cancer Mother        peritoneal/?ovarian cells   Ovarian cancer Mother        ?   Hypertension Mother    Stroke Father    Diabetes Father    Heart attack Father    Hypertension Father    Hyperlipidemia Father    Seizures Father        epilepsy   Rheum arthritis Father    Diabetes Sister    Colon cancer Maternal Grandmother    Colon cancer Other        grandparent   Diabetes Other        parent, grandparent, other relative   Hypertension Other        parent, other relative   Stroke Other        parent, other relative  :   Social History   Socioeconomic History   Marital status: Married    Spouse name: Shaun   Number of children: 3   Years of education: 12   Highest education level: Not on file  Occupational History   Occupation: Admin Assitant  Tobacco Use   Smoking status: Never   Smokeless tobacco: Never   Tobacco comments:    Married, lives at home with spouse & 3 dtrs. 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 used  Substance and Sexual Activity   Alcohol use: Yes    Alcohol/week: 1.0 standard drink of alcohol    Types: 1 Standard drinks or equivalent per week    Comment: sometimes   Drug use: No   Sexual activity: Yes    Partners: Male    Birth control/protection: Surgical    Comment: Tubal/TAH/BSO  Other Topics Concern   Not on file  Social History Narrative   Lives with spouse   Caffeine use: 1-2 cups coffee per day   Right handed    Social Determinants of Health   Financial Resource Strain: Not on file  Food Insecurity: Not on file  Transportation Needs: Not on file  Physical Activity: Not on file  Stress: Not on file  Social Connections: Not on file  Intimate Partner Violence: Not on file  :  Pertinent items are noted in HPI.  Exam:  General appearance: alert and cooperative appeared without distress. Head:  atraumatic without any abnormalities. Eyes: conjunctivae/corneas clear. PERRL.  Sclera anicteric. Throat: lips, mucosa, and tongue normal; without oral thrush or ulcers. Resp: clear to auscultation bilaterally without rhonchi, wheezes or dullness to percussion. Cardio: regular rate and rhythm, S1, S2 normal, no murmur, click, rub or gallop GI: soft, non-tender; bowel sounds normal; no masses,  no organomegaly Skin: Skin color, texture, turgor normal. No rashes or lesions Lymph nodes: Cervical, supraclavicular, and  axillary nodes normal. Neurologic: Grossly normal without any motor, sensory or deep tendon reflexes. Musculoskeletal: No joint deformity or effusion.    Assessment and Plan:    51 year old with:  1.  Right thalamic infarct associated with left-sided weakness and neurological deficits that are improving.  She is currently on aspirin but has history of protein S deficiency.  The natural course of this finding were discussed at this time and management choices were reviewed.  The role for full dose anticoagulation utilizing vitamin K antagonist versus a DOAC were discussed.  This issue remains controversial in this particular setting and the benefit is unclear of adding anticoagulation to aspirin.  Alternative option is to continue with aspirin alone and if she developed any subsequent arterial or venous thrombosis in the future, starting DOAC would be considered.   She will think about these options at this time and I will discuss her case with Dr. Leta Baptist regarding whether there is any contraindication for a DOAC.  2.  Follow-up: Will be in the next few weeks to follow her progress.  45  minutes were dedicated to this visit. The time was spent on reviewing laboratory data, imaging studies, discussing treatment options, discussing differential diagnosis and answering questions regarding future plan.     A copy of this consult has been forwarded to the requesting  physician.

## 2022-02-10 ENCOUNTER — Telehealth: Payer: Self-pay | Admitting: Oncology

## 2022-02-10 NOTE — Telephone Encounter (Signed)
Scheduled per 10/13 los, patient has been called and notified.

## 2022-02-24 ENCOUNTER — Inpatient Hospital Stay: Payer: BC Managed Care – PPO | Attending: Oncology | Admitting: Oncology

## 2022-02-24 DIAGNOSIS — I639 Cerebral infarction, unspecified: Secondary | ICD-10-CM

## 2022-02-24 NOTE — Progress Notes (Signed)
Hematology and Oncology Follow Up for Telemedicine Visits  Linda Castro [id number] 1971/01/07 51 y.o. 02/24/2022 12:26 PM Merrilee Seashore, MDRamachandran, Mauro Kaufmann, MD   I connected with Ms. Linda Castro on 02/24/22 at  1:00 PM EDT by telephone visit and verified that I am speaking with the correct person using two identifiers.   I discussed the limitations, risks, security and privacy concerns of performing an evaluation and management service by telemedicine and the availability of in-person appointments. I also discussed with the patient that there may be a patient responsible charge related to this service. The patient expressed understanding and agreed to proceed.  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     Current therapy: Aspirin 81 mg daily  Interim History: Ms. Rusk reports feeling well without any major complaints.  She does have some residual issues with taste since her stroke but has no other complaints or residual issues.     Medications: I have reviewed the patient's current medications.  Current Outpatient Medications  Medication Sig Dispense Refill   aspirin EC 81 MG tablet Take 81 mg by mouth daily. Swallow whole.     Multiple Vitamins-Iron (MULTIVITAMINS WITH IRON) TABS tablet Take 1 tablet by mouth daily.     omeprazole (PRILOSEC) 40 MG capsule Take 1 capsule (40 mg total) by mouth daily. 30 capsule 3   rosuvastatin (CRESTOR) 10 MG tablet Take 10 mg by mouth at bedtime.     telmisartan (MICARDIS) 40 MG tablet TAKE 1 TABLET BY MOUTH EVERY DAY 90 tablet 1   verapamil (CALAN-SR) 120 MG CR tablet TAKE 1 TABLET BY MOUTH EVERY DAY IN THE MORNING (Patient taking differently: Take 120 mg by mouth daily.) 90 tablet 1   No current  facility-administered medications for this visit.   Facility-Administered Medications Ordered in Other Visits  Medication Dose Route Frequency Provider Last Rate Last Admin   sodium chloride flush (NS) 0.9 % injection 10 mL  10 mL Intravenous PRN Penumalli, Vikram R, MD   20 mL at 12/20/21 0825     Allergies:  Allergies  Allergen Reactions   Hydrocodone Nausea And Vomiting   Oxycodone Nausea And Vomiting   Oxycodone-Acetaminophen     Other reaction(s): nauseous   Amoxicillin     GI upset   Codeine Nausea And Vomiting   Lisinopril Hives and Rash    Tingling sensation in face.       Lab Results: Lab Results  Component Value Date   WBC 7.0 11/10/2021   HGB 14.6 11/10/2021   HCT 43.0 11/10/2021   MCV 82.8 11/10/2021   PLT 231 11/10/2021     Chemistry      Component Value Date/Time   NA 141 11/10/2021 1952   NA 143 12/01/2019 0833   K 3.4 (L) 11/10/2021 1952   CL 102 11/10/2021 1952   CO2 28 11/10/2021 1930   BUN 12 11/10/2021 1952   BUN 13 12/01/2019 0833   CREATININE 0.80 11/10/2021 1952   CREATININE 0.69 04/15/2013 0836      Component Value Date/Time   CALCIUM 9.5 11/10/2021 1930   ALKPHOS 38 11/10/2021 1930   AST 30 11/10/2021 1930   ALT 18 11/10/2021 1930   BILITOT 0.6 11/10/2021 1930   BILITOT 0.5 08/22/2019 0830  Impression and Plan:   51 year old with:   1.  Right thalamic infarct associated with left-sided weakness diagnosed in July 2023.  This is in the setting of protein S deficiency.    Treatment choices were discussed at this time and the need for stronger anticoagulation was reviewed.  Her case was discussed with Dr. Leta Baptist and felt that her stroke is likely a small vessel disease related to hypertension rather than hypercoagulability or embolic phenomena.  At this time, I do not recommend any additional anticoagulation unless she has a venous thromboembolism.  She is agreeable with this plan and all her questions were  answered.   2.  Follow-up: We will be as needed in the future.     I discussed the assessment and treatment plan with the patient. The patient was provided an opportunity to ask questions and all were answered. The patient agreed with the plan and demonstrated an understanding of the instructions.   The patient was advised to call back or seek an in-person evaluation if the symptoms worsen or if the condition fails to improve as anticipated.  I provided 20 minutes of non face-to-face telephone visit time during this encounter, the time was spent on reviewing her disease status, treatment choices and discussing the findings of discussion with other consultants.  Zola Button, MD 02/24/2022 12:26 PM

## 2022-03-03 ENCOUNTER — Encounter: Payer: Self-pay | Admitting: Diagnostic Neuroimaging

## 2022-03-06 NOTE — Telephone Encounter (Signed)
Please advise, per pt previous message, she is having some cognitive/memory impairment as well as abnormal taste that was mention during last visit.

## 2022-05-24 ENCOUNTER — Encounter: Payer: Self-pay | Admitting: Diagnostic Neuroimaging

## 2022-05-24 ENCOUNTER — Ambulatory Visit: Payer: BC Managed Care – PPO | Admitting: Diagnostic Neuroimaging

## 2022-05-24 ENCOUNTER — Telehealth: Payer: Self-pay | Admitting: Diagnostic Neuroimaging

## 2022-05-24 VITALS — BP 149/92 | HR 69 | Ht 67.0 in | Wt 174.0 lb

## 2022-05-24 DIAGNOSIS — R413 Other amnesia: Secondary | ICD-10-CM

## 2022-05-24 MED ORDER — SERTRALINE HCL 25 MG PO TABS: 25.0000 mg | ORAL_TABLET | Freq: Every day | ORAL | 6 refills | Status: AC

## 2022-05-24 NOTE — Patient Instructions (Addendum)
-   check MRI brain (due to worsening memory) - continue aspirin, statin, BP control - follow up sleep study - consider sertraline '25mg'$  daily

## 2022-05-24 NOTE — Progress Notes (Signed)
[facility]  PATIENT: Linda Castro DOB: [birth year]  REFERRING CLINICIAN: Deon Pilling, NP HISTORY FROM: patient  REASON FOR VISIT: follow up   HISTORICAL  CHIEF COMPLAINT:  Chief Complaint  Patient presents with   New Patient (Initial Visit)    Patient in room #6 with her husband. Patient here today to discuss memory loss.    HISTORY OF PRESENT ILLNESS:   UPDATE (05/24/22, VRP): Since last visit, now with some mild memory issues at work, lower confidence, some depression and anxiety issues. Started around Oct 2023. No new numbness, weakness, balance issues. BP still a bit high, but improving.   PRIOR HPI (12/01/21): 52 year old female here for evaluation of numbness, abnormal MRI brain.  11/10/2021 patient woke up with numbness on her tongue and bilateral lower lip.  Her taste also felt off.  She had some heaviness on her left arm and left leg.  Patient went to the emergency room for evaluation.  She had CT scan head which were unremarkable.  Due to prolonged wait she ended up going home for complete evaluation.  She went to PCP within a few days and then had MRI of the brain with and without contrast on 11/23/2021.  This demonstrated a possible subacute stroke in the right thalamus.  Subtle lesion noted in the right internal auditory canal, vascular enhancement versus schwannoma.  Possibility of intracranial metastasis was also raised.  Since that time patient's symptoms have slightly improved.  Left-sided symptoms have resolved.  Numbness on the tongue and lip have greatly improved.  She still feels abnormal taste sensation.  Patient does have hypertension, which has been higher in the last few months.  She is on medication.  Since MRI results have returned the patient is now on aspirin 81 mg a day and rosuvastatin.    REVIEW OF SYSTEMS: Full 14 system review of systems performed and negative with exception of: as per HPI.  ALLERGIES: Allergies   Allergen Reactions   Hydrocodone Nausea And Vomiting   Oxycodone Nausea And Vomiting   Oxycodone-Acetaminophen     Other reaction(s): nauseous   Amoxicillin     GI upset   Codeine Nausea And Vomiting   Lisinopril Hives and Rash    Tingling sensation in face.     HOME MEDICATIONS: Outpatient Medications Prior to Visit  Medication Sig Dispense Refill   aspirin EC 81 MG tablet Take 81 mg by mouth daily. Swallow whole.     Multiple Vitamins-Iron (MULTIVITAMINS WITH IRON) TABS tablet Take 1 tablet by mouth daily.     omeprazole (PRILOSEC) 40 MG capsule Take 1 capsule (40 mg total) by mouth daily. 30 capsule 3   rosuvastatin (CRESTOR) 10 MG tablet Take 10 mg by mouth at bedtime.     verapamil (CALAN-SR) 120 MG CR tablet TAKE 1 TABLET BY MOUTH EVERY DAY IN THE MORNING (Patient taking differently: Take 120 mg by mouth daily.) 90 tablet 1   telmisartan (MICARDIS) 40 MG tablet TAKE 1 TABLET BY MOUTH EVERY DAY 90 tablet 1   Facility-Administered Medications Prior to Visit  Medication Dose Route Frequency Provider Last Rate Last Admin   sodium chloride flush (NS) 0.9 % injection 10 mL  10 mL Intravenous PRN Taiana Temkin, Earlean Polka, MD   20 mL at 12/20/21 0825    PAST MEDICAL HISTORY: Past Medical History:  Diagnosis Date   ANEMIA, IRON DEFICIENCY 09/08/2009   BREAST CYST, RIGHT 03/10/2010   Dysmenorrhea    Dyspareunia    FATIGUE 09/08/2009  Fibroadenoma of right breast    Fibroid    GERD (gastroesophageal reflux disease)    Hypercholesterolemia    HYPERTENSION 09/08/2009   history, no longer on medications   MASTITIS 03/10/2010   Other cerebral infarction ([place])    SEIZURE DISORDER 09/08/2009   childhood history, family history of seizures   SYNCOPE 01/16/2010   Urinary incontinence     PAST SURGICAL HISTORY: Past Surgical History:  Procedure Laterality Date   ABDOMINAL HYSTERECTOMY Bilateral 07/01/2013   Procedure: TOTAL ABDOMINAL HYSTERECTOMY WITH BILATERAL SALPINGO  OOPHORECTOMY;  Surgeon: Jamey Reas de Berton Lan, MD;  Location: [facility];  Service: Gynecology;  Laterality: Bilateral;   APPENDECTOMY  2006   BREAST SURGERY  2011   right--fibroadenoma removed   [place]   x2   TUBAL LIGATION  1996    FAMILY HISTORY: Family History  Problem Relation Age of Onset   Diabetes Mother    Cancer Mother        peritoneal/?ovarian cells   Ovarian cancer Mother        ?   Hypertension Mother    Stroke Father    Diabetes Father    Heart attack Father    Hypertension Father    Hyperlipidemia Father    Seizures Father        epilepsy   Rheum arthritis Father    Diabetes Sister    Colon cancer Maternal Grandmother    Colon cancer Other        grandparent   Diabetes Other        parent, grandparent, other relative   Hypertension Other        parent, other relative   Stroke Other        parent, other relative    SOCIAL HISTORY: Social History   Socioeconomic History   Marital status: Married    Spouse name: Shaun   Number of children: 3   Years of education: 12   Highest education level: Not on file  Occupational History   Occupation: Admin Assitant  Tobacco Use   Smoking status: Never   Smokeless tobacco: Never   Tobacco comments:    Married, lives at home with spouse & 3 dtrs. 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 used  Substance and Sexual Activity   Alcohol use: Yes    Alcohol/week: 1.0 standard drink of alcohol    Types: 1 Standard drinks or equivalent per week    Comment: sometimes   Drug use: No   Sexual activity: Yes    Partners: Male    Birth control/protection: Surgical    Comment: Tubal/TAH/BSO  Other Topics Concern   Not on file  Social History Narrative   Lives with spouse   Caffeine use: 1-2 cups coffee per day   Right handed    Social Determinants of Health   Financial Resource Strain: Not on file  Food Insecurity: Not on file   Transportation Needs: Not on file  Physical Activity: Not on file  Stress: Not on file  Social Connections: Not on file  Intimate Partner Violence: Not on file     PHYSICAL EXAM  GENERAL EXAM/CONSTITUTIONAL: Vitals:  Vitals:   05/24/22 1140 05/24/22 1141  BP: (!) 159/92 (!) 149/92  Pulse: 68 69  Weight: 174 lb (78.9 kg)   Height: '5\' 7"'$  (1.702 m)    Body mass index is 27.25 kg/m. Wt Readings from Last  3 Encounters:  05/24/22 174 lb (78.9 kg)  02/03/22 178 lb 1.6 oz (80.8 kg)  12/01/21 178 lb 3.2 oz (80.8 kg)   Patient is in no distress; well developed, nourished and groomed; neck is supple  CARDIOVASCULAR: Examination of carotid arteries is normal; no carotid bruits Regular rate and rhythm, no murmurs Examination of peripheral vascular system by observation and palpation is normal  EYES: Ophthalmoscopic exam of optic discs and posterior segments is normal; no papilledema or hemorrhages No results found.  MUSCULOSKELETAL: Gait, strength, tone, movements noted in Neurologic exam below  NEUROLOGIC: MENTAL STATUS:     05/24/2022   11:47 AM  MMSE - Mini Mental State Exam  Orientation to time 5  Orientation to Place 5  Registration 3  Attention/ Calculation 0  Recall 2  Language- name 2 objects 2  Language- repeat 1  Language- follow 3 step command 2  Language- read & follow direction 1  Write a sentence 1  Copy design 0  Total score 22   awake, alert, oriented to person, place and time recent and remote memory intact normal attention and concentration language fluent, comprehension intact, naming intact fund of knowledge appropriate  CRANIAL NERVE:  2nd - no papilledema on fundoscopic exam 2nd, 3rd, 4th, 6th - pupils equal and reactive to light, visual fields full to confrontation, extraocular muscles intact, no nystagmus 5th - facial sensation symmetric 7th - facial strength symmetric 8th - hearing intact 9th - palate elevates symmetrically, uvula  midline 11th - shoulder shrug symmetric 12th - tongue protrusion midline  MOTOR:  normal bulk and tone, full strength in the BUE, BLE RIGHT ARM SLIGHTLY ORBITS AROUND LEFT  SENSORY:  normal and symmetric to light touch, pinprick, temperature, vibration  COORDINATION:  finger-nose-finger, fine finger movements normal  REFLEXES:  deep tendon reflexes present and symmetric  GAIT/STATION:  narrow based gait     DIAGNOSTIC DATA (LABS, IMAGING, TESTING) - I reviewed patient records, labs, notes, testing and imaging myself where available.  Lab Results  Component Value Date   WBC 7.0 11/10/2021   HGB 14.6 11/10/2021   HCT 43.0 11/10/2021   MCV 82.8 11/10/2021   PLT 231 11/10/2021      Component Value Date/Time   NA 141 11/10/2021 1952   NA 143 12/01/2019 0833   K 3.4 (L) 11/10/2021 1952   CL 102 11/10/2021 1952   CO2 28 11/10/2021 1930   GLUCOSE 87 11/10/2021 1952   BUN 12 11/10/2021 1952   BUN 13 12/01/2019 0833   CREATININE 0.80 11/10/2021 1952   CREATININE 0.69 04/15/2013 0836   CALCIUM 9.5 11/10/2021 1930   PROT 8.0 11/10/2021 1930   PROT 7.4 08/22/2019 0830   ALBUMIN 4.6 11/10/2021 1930   ALBUMIN 4.9 08/22/2019 0830   AST 30 11/10/2021 1930   ALT 18 11/10/2021 1930   ALKPHOS 38 11/10/2021 1930   BILITOT 0.6 11/10/2021 1930   BILITOT 0.5 08/22/2019 0830   GFRNONAA >60 11/10/2021 1930   GFRAA 97 12/01/2019 0833   Lab Results  Component Value Date   CHOL 201 (H) 10/10/2019   HDL 59 10/10/2019   LDLCALC 122 (H) 10/10/2019   TRIG 113 10/10/2019   CHOLHDL 3.4 01/16/2019   No results found for: "HGBA1C" Lab Results  Component Value Date   VITAMINB12 255 09/08/2009   Lab Results  Component Value Date   TSH 1.016 04/15/2013    07/05/16 MRI brain (with and without) [I reviewed images myself and agree with interpretation. -VRP]  Negative exam.  No explanation for symptoms.  11/23/21 MRI brain (with and without) [I reviewed images myself and agree with  interpretation. -VRP]  Indeterminate 5 mm focus of abnormal enhancement within the right thalamus. While this may reflect a late subacute infarct, alternative etiologies (such as an isolated intracranial metastasis) cannot be excluded. A short-interval 2-3 month follow-up brain MRI without and with contrast is recommended to monitor this finding.   2 mm focus of asymmetrically prominent enhancement within the right internal auditory canal. This may reflect asymmetric vascular enhancement or a vestibular schwannoma. An internal auditory canal protocol is recommended at time of MRI follow-up to better assess this finding.   Mild multifocal T2 FLAIR hyperintense signal abnormality within the cerebral white matter, nonspecific but most often secondary to chronic small vessel ischemia.   Partially empty sella turcica. While this finding often reflects incidental anatomic variation, it can also be associated with idiopathic intracranial hypertension (pseudotumor cerebri).   12/16/21 MRI brain (with and without) demonstrating: -Stable right thalamic T2 hyperintense focus, without abnormal enhancement.  Likely represents chronic ischemic infarct. -No abnormal enhancement noted within the right IAC; prior finding likely asymmetric vascular enhancement.    ASSESSMENT AND PLAN  52 y.o. year old female here with:   Dx:  1. Memory changes     PLAN:  RIGHT THALAMIC LESION (likely small vessel thrombosis and lacunar infarction; CNS inflamm, autoimmune, neoplasm less likely) - check MRI brain (due to worsening memory) - continue aspirin, statin, BP control - follow up sleep study - consider sertraline '25mg'$  daily (for depression, anxiety sxs)  RIGHT INTERNAL AUDITORY CANAL LESION (asymptomatic) - mild asymmetric vascular enhancement has resolved  Meds ordered this encounter  Medications   sertraline (ZOLOFT) 25 MG tablet    Sig: Take 1 tablet (25 mg total) by mouth daily.     Dispense:  30 tablet    Refill:  6   Orders Placed This Encounter  Procedures   MR [place]   Return for pending test results, pending if symptoms worsen or fail to improve.     Penni Bombard, MD 07/18/7122, 58:09 PM Certified in Neurology, Neurophysiology and [facility] [address], [place], [place] [address] [phone]

## 2022-05-24 NOTE — Telephone Encounter (Signed)
Pt is requesting a call to schedule sleep consult that was ordered back in August of 2023.

## 2022-05-26 ENCOUNTER — Telehealth: Payer: Self-pay | Admitting: Diagnostic Neuroimaging

## 2022-05-26 NOTE — Telephone Encounter (Signed)
Linda Castro: [id number] exp. 05/26/22-06/24/22 sent to [facility] [phone]

## 2022-06-07 ENCOUNTER — Ambulatory Visit
Admission: RE | Admit: 2022-06-07 | Discharge: 2022-06-07 | Disposition: A | Discharge status: Home / Self Care | Payer: BC Managed Care – PPO | Source: Ambulatory Visit | Attending: Diagnostic Neuroimaging | Admitting: Diagnostic Neuroimaging

## 2022-06-07 DIAGNOSIS — R413 Other amnesia: Secondary | ICD-10-CM | POA: Diagnosis not present

## 2022-06-07 MED ORDER — GADOPICLENOL 0.5 MMOL/ML IV SOLN
7.5000 mL | Freq: Once | INTRAVENOUS | Status: AC | PRN
  Administered 2022-06-07: 7.5 mL via INTRAVENOUS

## 2022-06-13 ENCOUNTER — Encounter: Payer: Self-pay | Admitting: Diagnostic Neuroimaging

## 2022-06-19 ENCOUNTER — Other Ambulatory Visit: Payer: Self-pay | Admitting: Diagnostic Neuroimaging

## 2022-06-21 ENCOUNTER — Ambulatory Visit (INDEPENDENT_AMBULATORY_CARE_PROVIDER_SITE_OTHER): Payer: BC Managed Care – PPO | Admitting: Neurology

## 2022-06-21 ENCOUNTER — Encounter: Payer: Self-pay | Admitting: Neurology

## 2022-06-21 VITALS — BP 129/82 | HR 70 | Ht 67.0 in | Wt 174.8 lb

## 2022-06-21 DIAGNOSIS — G4719 Other hypersomnia: Secondary | ICD-10-CM

## 2022-06-21 DIAGNOSIS — R0683 Snoring: Secondary | ICD-10-CM

## 2022-06-21 DIAGNOSIS — R419 Unspecified symptoms and signs involving cognitive functions and awareness: Secondary | ICD-10-CM

## 2022-06-21 DIAGNOSIS — Z820 Family history of epilepsy and other diseases of the nervous system: Secondary | ICD-10-CM

## 2022-06-21 DIAGNOSIS — Z9189 Other specified personal risk factors, not elsewhere classified: Secondary | ICD-10-CM | POA: Diagnosis not present

## 2022-06-21 DIAGNOSIS — R351 Nocturia: Secondary | ICD-10-CM

## 2022-06-21 DIAGNOSIS — E663 Overweight: Secondary | ICD-10-CM

## 2022-06-21 DIAGNOSIS — N3944 Nocturnal enuresis: Secondary | ICD-10-CM

## 2022-06-21 NOTE — Patient Instructions (Signed)

## 2022-06-21 NOTE — Progress Notes (Signed)
Subjective:    Patient ID: Linda Castro is a 52 y.o. female.  HPI    Linda Age, MD, PhD [facility] [address] [place], [place] [address]  Dear Linda Castro,  I saw your patient, Linda Castro, upon your kind request, in my sleep clinic today for initial consultation of her sleep disorder, in particular, concern for underlying obstructive sleep apnea.  The patient is unaccompanied today. As your know, Linda Castro is a 52 year old female with an underlying medical history of anemia, hypertension, hyperlipidemia, history of childhood seizures, syncope, reflux disease, cognitive complaints, anxiety, depression, and overweight state, who reports snoring and excessive daytime somnolence.  Her Epworth sleepiness score is 8 out of 24, fatigue severity score is 37 out of 63.  I reviewed your office visit notes from 12/01/2021 as well as 05/24/2022.  She had a concern for stroke last year, recent repeat brain MRI showed benign findings.  She has a bedtime of around 10 PM and rise time around 6 AM.  She has a family history of sleep apnea, her dad had sleep apnea, he also had a history of stroke and heart attack.  Her mom passed away from cancer.  She does not have recurrent morning headaches but has significant nocturia about 3 times per average night.  She has had recent enuresis a few times, has not discussed this issue with her GYN yet.  She has a history of hysterectomy as well.  She drinks alcohol rarely.  She drinks caffeine in the form of coffee, usually 1 cup/day, she is a non-smoker.  She lives with her husband.  She works as an Scientist, water quality at [employer].  She has 3 grown daughters, ages 74, 32 and 58, her oldest daughter is currently staying with them with her family including her 3 children, ages 66, 21 and 65.  They have no pets in the household.  Her Past Medical History Is Significant For: Past Medical History:  Diagnosis Date    ANEMIA, IRON DEFICIENCY 09/08/2009   BREAST CYST, RIGHT 03/10/2010   Dysmenorrhea    Dyspareunia    FATIGUE 09/08/2009   Fibroadenoma of right breast    Fibroid    GERD (gastroesophageal reflux disease)    Hypercholesterolemia    HYPERTENSION 09/08/2009   history, no longer on medications   MASTITIS 03/10/2010   Other cerebral infarction ([place])    SEIZURE DISORDER 09/08/2009   childhood history, family history of seizures   SYNCOPE 01/16/2010   Urinary incontinence     Her Past Surgical History Is Significant For: Past Surgical History:  Procedure Laterality Date   ABDOMINAL HYSTERECTOMY Bilateral 07/01/2013   Procedure: TOTAL ABDOMINAL HYSTERECTOMY WITH BILATERAL SALPINGO OOPHORECTOMY;  Surgeon: Linda Reas de Berton Lan, MD;  Location: [facility];  Service: Gynecology;  Laterality: Bilateral;   APPENDECTOMY  2006   BREAST SURGERY  2011   right--fibroadenoma removed   [place]   x2   [place]    Her Family History Is Significant For: Family History  Problem Relation Castro of Onset   Diabetes Mother    Cancer Mother        peritoneal/?ovarian cells   Ovarian cancer Mother        ?   Hypertension Mother    Stroke Father    Diabetes Father    Heart attack Father    Hypertension Father    Hyperlipidemia Father  Seizures Father        epilepsy   Rheum arthritis Father    Sleep apnea Father    Diabetes Sister    Colon cancer Maternal Grandmother    Colon cancer Other        grandparent   Diabetes Other        parent, grandparent, other relative   Hypertension Other        parent, other relative   Stroke Other        parent, other relative    Her Social History Is Significant For: Social History   Socioeconomic History   Marital status: Married    Spouse name: Linda Castro   Number of children: 3   Years of education: 12   Highest education level: Not on file  Occupational History   Occupation: Admin Assitant  Tobacco  Use   Smoking status: Never   Smokeless tobacco: Never   Tobacco comments:    Married, lives at home with spouse & 3 dtrs. 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 used  Substance and Sexual Activity   Alcohol use: Not Currently    Alcohol/week: 1.0 standard drink of alcohol    Types: 1 Standard drinks or equivalent per week    Comment: sometimes   Drug use: No   Sexual activity: Yes    Partners: Male    Birth control/protection: Surgical    Comment: Tubal/TAH/BSO  Other Topics Concern   Not on file  Social History Narrative   Lives with spouse   Caffeine use: 1-2 cups coffee per day   Right handed    Social Determinants of Health   Financial Resource Strain: Not on file  Food Insecurity: Not on file  Transportation Needs: Not on file  Physical Activity: Not on file  Stress: Not on file  Social Connections: Not on file    Her Allergies Are:  Allergies  Allergen Reactions   Hydrocodone Nausea And Vomiting   Oxycodone Nausea And Vomiting   Oxycodone-Acetaminophen     Other reaction(s): nauseous   Amoxicillin     GI upset   Codeine Nausea And Vomiting   Lisinopril Hives and Rash    Tingling sensation in face.   :   Her Current Medications Are:  Outpatient Encounter Medications as of 06/21/2022  Medication Sig   aspirin EC 81 MG tablet Take 81 mg by mouth daily. Swallow whole.   Multiple Vitamin (MULTIVITAMIN PO) Take by mouth.   Multiple Vitamins-Iron (MULTIVITAMINS WITH IRON) TABS tablet Take 1 tablet by mouth daily.   omeprazole (PRILOSEC) 40 MG capsule Take 1 capsule (40 mg total) by mouth daily.   rosuvastatin (CRESTOR) 10 MG tablet Take 10 mg by mouth at bedtime.   sertraline (ZOLOFT) 25 MG tablet Take 1 tablet (25 mg total) by mouth daily.   verapamil (CALAN-SR) 120 MG CR tablet TAKE 1 TABLET BY MOUTH EVERY DAY IN THE MORNING (Patient taking differently: Take 120 mg by mouth daily.)   Facility-Administered Encounter  Medications as of 06/21/2022  Medication   sodium chloride flush (NS) 0.9 % injection 10 mL  :   Review of Systems:  Out of a complete 14 point review of systems, all are reviewed and negative with the exception of these symptoms as listed below:  Review of Systems  Neurological:        Pt here for sleep consult Pt snores, few headaches,little fatigue ,hypertension ,stroke 2023 Pt  denies sleep study CPAP machine     ESS:8 FSS;37    Objective:  Neurological Exam  Physical Exam Physical Examination:   Vitals:   06/21/22 1409  BP: 129/82  Pulse: 70    General Examination: The patient is a very pleasant 52 y.o. female in no acute distress. She appears well-developed and well-nourished and well groomed.   HEENT: Normocephalic, atraumatic, pupils are equal, round and reactive to light, extraocular tracking is good without limitation to gaze excursion or nystagmus noted. Hearing is grossly intact. Face is symmetric with normal facial animation. Speech is clear with no dysarthria noted. There is no hypophonia. There is no lip, neck/head, jaw or voice tremor. Neck is supple with full range of passive and active motion. There are no carotid bruits on auscultation. Oropharynx exam reveals: mild mouth dryness, good dental hygiene and mild airway crowding, due to small airway entry, tonsils on the smaller side but uvula larger.  She has a minimal overbite.  Neck circumference 14-5/8 inches.  Tongue protrudes centrally and palate elevates symmetrically.  Chest: Clear to auscultation without wheezing, rhonchi or crackles noted.  Heart: S1+S2+0, regular and normal without murmurs, rubs or gallops noted.   Abdomen: Soft, non-tender and non-distended.  Extremities: There is no pitting edema in the distal lower extremities bilaterally.   Skin: Warm and dry without trophic changes noted.   Musculoskeletal: exam reveals no obvious joint deformities.   Neurologically:  Mental status: The  patient is awake, alert and oriented in all 4 spheres. Her immediate and remote memory, attention, language skills and fund of knowledge are appropriate. There is no evidence of aphasia, agnosia, apraxia or anomia. Speech is clear with normal prosody and enunciation. Thought process is linear. Mood is normal and affect is normal.  Cranial nerves II - XII are as described above under HEENT exam.  Motor exam: Normal bulk, strength and tone is noted. There is no obvious action or resting tremor.  Fine motor skills and coordination: grossly intact.  Cerebellar testing: No dysmetria or intention tremor. There is no truncal or gait ataxia.  Sensory exam: intact to light touch in the upper and lower extremities.  Gait, station and balance: She stands easily. No veering to one side is noted. No leaning to one side is noted. Posture is Castro-appropriate and stance is narrow based. Gait shows normal stride length and normal pace. No problems turning are noted.   Assessment and Plan:  In summary, Maham C Nemeroff is a very pleasant 52 y.o.-year old female with an underlying medical history of anemia, hypertension, hyperlipidemia, history of childhood seizures, syncope, reflux disease, cognitive complaints, anxiety, depression, and overweight state, whose history and physical exam are concerning for sleep disordered breathing, particularly obstructive sleep apnea (OSA). A laboratory attended sleep study is typically considered "gold standard" for evaluation of sleep disordered breathing.  She would be willing to proceed with a laboratory attended sleep study. I had a long chat with the patient about my findings and the diagnosis of sleep apnea, particularly OSA, its prognosis and treatment options. We talked about medical/conservative treatments, surgical interventions and non-pharmacological approaches for symptom control. I explained, in particular, the risks and ramifications of untreated moderate to severe OSA,  especially with respect to developing cardiovascular disease down the road, including congestive heart failure (CHF), difficult to treat hypertension, cardiac arrhythmias (particularly A-fib), neurovascular complications including TIA, stroke and dementia. Even type 2 diabetes has, in part, been linked to untreated OSA. Symptoms of untreated OSA may  include (but may not be limited to) daytime sleepiness, nocturia (i.e. frequent nighttime urination), memory problems, mood irritability and suboptimally controlled or worsening mood disorder such as depression and/or anxiety, lack of energy, lack of motivation, physical discomfort, as well as recurrent headaches, especially morning or nocturnal headaches. We talked about the importance of maintaining a healthy lifestyle and striving for healthy weight.  In addition, we talked about the importance of striving for and maintaining good sleep hygiene. I recommended a sleep study at this time. I outlined the differences between a laboratory attended sleep study which is considered more comprehensive and accurate over the option of a home sleep test (HST); the latter may lead to underestimation of sleep disordered breathing in some instances and does not help with diagnosing upper airway resistance syndrome and is not accurate enough to diagnose primary central sleep apnea typically. I outlined possible surgical and non-surgical treatment options of OSA, including the use of a positive airway pressure (PAP) device (i.e. CPAP, AutoPAP/APAP or BiPAP in certain circumstances), a custom-made dental device (aka oral appliance, which would require a referral to a specialist dentist or orthodontist typically, and is generally speaking not considered for patients with full dentures or edentulous state), upper airway surgical options, such as traditional UPPP (which is not considered a first-line treatment) or the Inspire device (hypoglossal nerve stimulator, which would involve a  referral for consultation with an ENT surgeon, after careful selection, following inclusion criteria - also not first-line treatment). I explained the PAP treatment option to the patient in detail, as this is generally considered first-line treatment.  The patient indicated that she would be willing to try PAP therapy, if the need arises. I explained the importance of being compliant with PAP treatment, not only for insurance purposes but primarily to improve patient's symptoms symptoms, and for the patient's long term health benefit, including to reduce Her cardiovascular risks longer-term.    We will pick up our discussion about the next steps and treatment options after testing.  We will keep her posted as to the test results by phone call and/or MyChart messaging where possible.  We will plan to follow-up in sleep clinic accordingly as well.  I answered all her questions today and the patient was in agreement.   I encouraged her to call with any interim questions, concerns, problems or updates or email [place] through [place].  Generally speaking, sleep test authorizations may take up to 2 weeks, sometimes less, sometimes longer, the patient is encouraged to get in touch with [place] if they do not hear back from the sleep lab staff directly within the next 2 weeks.  Thank you very much for allowing me to participate in the care of this nice patient. If I can be of any further assistance to you please do not hesitate to talk to me.    Sincerely,   Linda Age, MD, PhD

## 2022-07-20 ENCOUNTER — Telehealth: Payer: Self-pay | Admitting: Neurology

## 2022-07-20 NOTE — Telephone Encounter (Signed)
NPSG- BCBS state No auth req   Patient is scheduled at [facility] 09/26/22 at 9 pm.  Mailed packet to the patient.

## 2022-09-25 NOTE — Telephone Encounter (Signed)
Patient called and stated she needs to cancel her NPSG due to death in the family she will call back to r/s at a later time.

## 2023-06-22 ENCOUNTER — Other Ambulatory Visit: Payer: Self-pay | Admitting: Obstetrics and Gynecology

## 2023-06-22 DIAGNOSIS — Z1231 Encounter for screening mammogram for malignant neoplasm of breast: Secondary | ICD-10-CM

## 2023-08-03 ENCOUNTER — Ambulatory Visit
Admission: RE | Admit: 2023-08-03 | Discharge: 2023-08-03 | Disposition: A | Discharge status: Home / Self Care | Payer: Self-pay | Source: Ambulatory Visit | Attending: Obstetrics and Gynecology | Admitting: Obstetrics and Gynecology

## 2023-08-03 DIAGNOSIS — Z1231 Encounter for screening mammogram for malignant neoplasm of breast: Secondary | ICD-10-CM

## 2023-10-18 ENCOUNTER — Emergency Department (HOSPITAL_COMMUNITY)

## 2023-10-18 ENCOUNTER — Encounter (HOSPITAL_COMMUNITY): Payer: Self-pay

## 2023-10-18 ENCOUNTER — Other Ambulatory Visit: Payer: Self-pay

## 2023-10-18 ENCOUNTER — Emergency Department (HOSPITAL_COMMUNITY)
Admission: EM | Admit: 2023-10-18 | Discharge: 2023-10-18 | Disposition: A | Discharge status: Home / Self Care | Attending: Emergency Medicine | Admitting: Emergency Medicine

## 2023-10-18 DIAGNOSIS — Z7982 Long term (current) use of aspirin: Secondary | ICD-10-CM | POA: Insufficient documentation

## 2023-10-18 DIAGNOSIS — R1013 Epigastric pain: Secondary | ICD-10-CM | POA: Diagnosis not present

## 2023-10-18 DIAGNOSIS — R1010 Upper abdominal pain, unspecified: Secondary | ICD-10-CM | POA: Diagnosis present

## 2023-10-18 LAB — COMPREHENSIVE METABOLIC PANEL WITH GFR
ALT: 22 U/L (ref 0–44)
AST: 31 U/L (ref 15–41)
Albumin: 4.2 g/dL (ref 3.5–5.0)
Alkaline Phosphatase: 31 U/L — ABNORMAL LOW (ref 38–126)
Anion gap: 9 (ref 5–15)
BUN: 12 mg/dL (ref 6–20)
CO2: 28 mmol/L (ref 22–32)
Calcium: 9.3 mg/dL (ref 8.9–10.3)
Chloride: 105 mmol/L (ref 98–111)
Creatinine, Ser: 0.91 mg/dL (ref 0.44–1.00)
GFR, Estimated: >60 mL/min (ref >60)
Glucose, Bld: 93 mg/dL (ref 70–99)
Potassium: 3.6 mmol/L (ref 3.5–5.1)
Sodium: 142 mmol/L (ref 135–145)
Total Bilirubin: 0.6 mg/dL (ref 0.0–1.2)
Total Protein: 7.1 g/dL (ref 6.5–8.1)

## 2023-10-18 LAB — CBC
HCT: 39.9 % (ref 36.0–46.0)
Hemoglobin: 12.5 g/dL (ref 12.0–15.0)
MCH: 26.5 pg (ref 26.0–34.0)
MCHC: 31.3 g/dL (ref 30.0–36.0)
MCV: 84.7 fL (ref 80.0–100.0)
Platelets: 278 10*3/uL (ref 150–400)
RBC: 4.71 MIL/uL (ref 3.87–5.11)
RDW: 14.4 % (ref 11.5–15.5)
WBC: 5.8 10*3/uL (ref 4.0–10.5)
nRBC: 0 % (ref 0.0–0.2)

## 2023-10-18 LAB — URINALYSIS, ROUTINE W REFLEX MICROSCOPIC
Bilirubin Urine: NEGATIVE
Glucose, UA: NEGATIVE mg/dL
Hgb urine dipstick: NEGATIVE
Ketones, ur: NEGATIVE mg/dL
Leukocytes,Ua: NEGATIVE
Nitrite: NEGATIVE
Protein, ur: NEGATIVE mg/dL
Specific Gravity, Urine: 1.016 (ref 1.005–1.030)
pH: 6 (ref 5.0–8.0)

## 2023-10-18 LAB — LIPASE, BLOOD: Lipase: 40 U/L (ref 11–51)

## 2023-10-18 MED ORDER — MAALOX MAX 400-400-40 MG/5ML PO SUSP: 5.0000 mL | Freq: Four times a day (QID) | ORAL | 0 refills | Status: AC | PRN

## 2023-10-18 MED ORDER — OMEPRAZOLE 40 MG PO CPDR: 40.0000 mg | DELAYED_RELEASE_CAPSULE | Freq: Every day | ORAL | 3 refills | Status: AC

## 2023-10-18 MED ORDER — FAMOTIDINE IN NACL 20-0.9 MG/50ML-% IV SOLN
20.0000 mg | Freq: Once | INTRAVENOUS | Status: AC
  Administered 2023-10-18: 20 mg via INTRAVENOUS
  Filled 2023-10-18: qty 50

## 2023-10-18 MED ORDER — ALUM & MAG HYDROXIDE-SIMETH 200-200-20 MG/5ML PO SUSP
30.0000 mL | Freq: Once | ORAL | Status: AC
  Administered 2023-10-18: 30 mL via ORAL
  Filled 2023-10-18: qty 30

## 2023-10-18 NOTE — Discharge Instructions (Signed)
 You have been seen and discharged from the emergency department.  I believe her symptoms are coming from stomach irritation.  You have been prescribed omeprazole  to restart, take as directed.  Have also prescribed you Maalox to drink as needed.  I establish care with gastroenterology for further evaluation.  Your blood work and gallbladder testing was normal today.  Follow-up with your primary provider for further evaluation and further care. Take home medications as prescribed. If you have any worsening symptoms or further concerns for your health please return to an emergency department for further evaluation.

## 2023-10-18 NOTE — ED Triage Notes (Signed)
 Pt has been having abd pain since Tuesday. C/O nausea/ lightheadedness/ vomiting/ diarrhea.

## 2023-10-18 NOTE — ED Provider Notes (Signed)
**Note Linda-Identified via Obfuscation**  [place] EMERGENCY DEPARTMENT AT [facility] Provider Note   CSN: [id number] Arrival date & time: 10/18/23  1040     Patient presents with: Abdominal Pain   Linda Castro is a 53 y.o. female.   HPI   53 year old female presents emergency department with upper abdominal pain associated with nausea and dry heaving.  Patient states this started 2 days ago.  She describes a cramping sensation in her upper abdomen that then resulted in excess saliva in her mouth and dry heaving.  A couple times she did have emesis, nonbloody.  Denies any fever or chills.  Last bowel movement was 2 days ago, no diarrhea.  Denies any genitourinary symptoms.  No chest pain or shortness of breath.  Prior to Admission medications   Medication Sig Start Date End Date Taking? Authorizing Provider  aspirin EC 81 MG tablet Take 81 mg by mouth daily. Swallow whole.    [provider]  FLONASE ALLERGY  RELIEF 50 MCG/ACT nasal spray Place 2 sprays into both nostrils daily as needed for allergies or rhinitis. 09/03/23   [provider]  Multiple Vitamin (MULTIVITAMIN PO) Take by mouth.    [provider]  Multiple Vitamins-Iron (MULTIVITAMINS WITH IRON) TABS tablet Take 1 tablet by mouth daily.    [provider]  omeprazole  (PRILOSEC) 40 MG capsule Take 1 capsule (40 mg total) by mouth daily. 01/20/16   Linda Dios, [place] A, MD  rosuvastatin  (CRESTOR ) 10 MG tablet Take 10 mg by mouth at bedtime. 11/29/21   [provider]  sertraline  (ZOLOFT ) 25 MG tablet Take 1 tablet (25 mg total) by mouth daily. 05/24/22   Penumalli, Vikram R, MD  valsartan  (DIOVAN ) 320 MG tablet Take 320 mg by mouth daily. 05/14/23   [provider]  verapamil  (CALAN ) 120 MG tablet Take 120 mg by mouth 2 (two) times daily. 05/22/23   [provider]  verapamil  (CALAN -SR) 120 MG CR tablet TAKE 1 TABLET BY MOUTH EVERY DAY IN THE MORNING Patient taking differently: Take 120 mg by  mouth daily. 12/03/20   Tolia, Sunit, DO    Allergies: Hydrocodone, Oxycodone, Oxycodone-acetaminophen , Amoxicillin, Codeine, and Lisinopril    Review of Systems  Constitutional:  Positive for appetite change. Negative for fever.  Respiratory:  Negative for shortness of breath.   Cardiovascular:  Negative for chest pain.  Gastrointestinal:  Positive for abdominal pain, nausea and vomiting. Negative for blood in stool and diarrhea.  Genitourinary:  Negative for flank pain.  Musculoskeletal:  Negative for back pain.  Skin:  Negative for rash.  Neurological:  Negative for headaches.    Updated Vital Signs BP (!) 159/117 (BP Location: Right Arm)   Pulse 82   Temp 98.7 F (37.1 C)   Resp 16   Ht 5' 7 (1.702 m)   Wt 82.6 kg   LMP 06/06/2013   SpO2 100%   BMI 28.51 kg/m   Physical Exam Vitals and nursing note reviewed.  Constitutional:      General: She is not in acute distress.    Appearance: Normal appearance.  HENT:     Head: Normocephalic.     Mouth/Throat:     Mouth: Mucous membranes are moist.   Cardiovascular:     Rate and Rhythm: Normal rate.  Pulmonary:     Effort: Pulmonary effort is normal. No respiratory distress.  Abdominal:     General: Bowel sounds are normal.     Palpations: Abdomen is soft.  Tenderness: There is abdominal tenderness in the epigastric area. There is no guarding or rebound.   Skin:    General: Skin is warm.   Neurological:     Mental Status: She is alert and oriented to person, place, and time. Mental status is at baseline.   Psychiatric:        Mood and Affect: Mood normal.     (all labs ordered are listed, but only abnormal results are displayed) Labs Reviewed  COMPREHENSIVE METABOLIC PANEL WITH GFR - Abnormal; Notable for the following components:      Result Value   Alkaline Phosphatase 31 (*)    All other components within normal limits  URINALYSIS, ROUTINE W REFLEX MICROSCOPIC - Abnormal; Notable for the following  components:   APPearance HAZY (*)    All other components within normal limits  LIPASE, BLOOD  CBC    EKG: None  Radiology: No results found.   Procedures   Medications Ordered in the ED  famotidine (PEPCID) IVPB 20 mg premix (20 mg Intravenous New Bag/Given 10/18/23 1230)  alum & mag hydroxide-simeth (MAALOX/MYLANTA) 200-200-20 MG/5ML suspension 30 mL (30 mLs Oral Given 10/18/23 1225)                                    Medical Decision Making Amount and/or Complexity of Data Reviewed Labs: ordered. Radiology: ordered.  Risk OTC drugs. Prescription drug management.   53 year old female presents emergency room with epigastric cramping discomfort, associated with nausea/dry heaving.  Currently feels mildly nauseous with an achiness in her epigastric area.  No associated chest pain or back pain.  Vitals are normal and stable.  EKG shows no acute ischemic changes.  Blood work is all normal and reassuring, urinalysis shows no focal finding.  Ultrasound of the gallbladder shows no abnormality.  After dose of Pepcid and a GI cocktail patient feels improved.  Low suspicion for ACS at this time with the atypical pattern and GI nature of presentation.  Discussed with the patient starting a daily PPI and following outpatient with a GI doctor.  Patient at this time appears safe and stable for discharge and close outpatient follow up. Discharge plan and strict return to ED precautions discussed, patient verbalizes understanding and agreement.     Final diagnoses:  None    ED Discharge Orders     None          Bari Roxie HERO, DO 10/18/23 1541

## 2023-10-18 NOTE — ED Notes (Addendum)
 Pt notes frequent urination since 10/16/23. No acute distress. Denies any chest pain, dizziness, shob, or headache. Pt ambulated to the bathroom alone with no problems. Denies any nausea currently, have vomited 3x in the past 24 hours.

## 2024-05-23 ENCOUNTER — Other Ambulatory Visit: Payer: Self-pay | Admitting: Internal Medicine

## 2024-05-23 DIAGNOSIS — R634 Abnormal weight loss: Secondary | ICD-10-CM

## 2024-05-23 DIAGNOSIS — R6881 Early satiety: Secondary | ICD-10-CM

## 2024-06-03 ENCOUNTER — Other Ambulatory Visit
